# Patient Record
Sex: Female | Born: 2000 | Race: Black or African American | Hispanic: No | Marital: Single | Smoking: Never smoker
Health system: Southern US, Community
[De-identification: ages and names within clinical notes are randomized; demographics above are authoritative.]

## PROBLEM LIST (undated history)

## (undated) DIAGNOSIS — M5126 Other intervertebral disc displacement, lumbar region: Secondary | ICD-10-CM

## (undated) HISTORY — DX: Other intervertebral disc displacement, lumbar region: M51.26

## (undated) HISTORY — PX: NO PAST SURGERIES: SHX2092

---
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
## 2021-09-23 ENCOUNTER — Other Ambulatory Visit: Payer: Self-pay | Admitting: Family Medicine

## 2021-09-23 ENCOUNTER — Ambulatory Visit
Admission: RE | Admit: 2021-09-23 | Discharge: 2021-09-23 | Disposition: A | Discharge status: Home / Self Care | Payer: Medicaid Other | Source: Ambulatory Visit | Attending: Family Medicine | Admitting: Family Medicine

## 2021-09-23 ENCOUNTER — Other Ambulatory Visit: Payer: Self-pay

## 2021-09-23 DIAGNOSIS — M25561 Pain in right knee: Secondary | ICD-10-CM

## 2021-09-23 DIAGNOSIS — M545 Low back pain, unspecified: Secondary | ICD-10-CM

## 2021-09-23 DIAGNOSIS — M25562 Pain in left knee: Secondary | ICD-10-CM

## 2021-12-11 NOTE — L&D Delivery Note (Signed)
Delivery Note Sydney Hart is a 21 y.o. G1P0 at [redacted]w[redacted]d admitted for IOL d/t elective/postdates.   GBS Status: Positive/-- (08/02 1610) Maximum Maternal Temperature: 98.9  Labor course: Initial SVE: 0/th/ballotable. Augmentation with: Pitocin, Cytotec, and IP Foley.  SROM: 3h 23m with clear fluid (2140), progressed rather quickly to complete and did not make it into water for waterbirth. Pushed in squatting position on bed, holding onto squat bar Birth: At 0137 a viable female was delivered via spontaneous vaginal delivery (Presentation: LOA). Nuchal cord present: No.  Head delivered w/ pt in squatting position, then leaned into semi-fowler's to allow for birth of remainder of body. Shoulders and body delivered in usual fashion. Infant placed directly on mom's abdomen for bonding/skin-to-skin, baby dried and stimulated. Cord clamped x 2 after 1 minute and cut by pt's mom.  Cord blood collected.  The placenta separated spontaneously and delivered via gentle cord traction.  Pitocin infused rapidly IV per protocol.  Fundus firm with massage.  Placenta inspected and appears to be intact with a 3 VC.  Placenta/Cord with the following complications: none .  Cord pH: not done Sponge and instrument count were correct x2.  Intrapartum complications:  None Anesthesia:   IV fentanyl and nitrous for labor pain relief; local for repair Episiotomy: none Lacerations:  2nd degree and Rt labial Suture Repair: 3.0 vicryl EBL (mL): 425 (from lac)   Infant: APGAR (1 MIN): 8   APGAR (5 MINS): 9   APGAR (10 MINS):    Infant weight: pending  Mom to postpartum.  Baby to Couplet care / Skin to Skin. Placenta to L&D   Plans to Breastfeed Contraception: abstinence Circumcision: wants inpatient  Note sent to [facility]: [facility] for pp visit.  Cheral Marker CNM, [facility] 08/09/2022 2:11 AM

## 2022-02-11 ENCOUNTER — Encounter (HOSPITAL_BASED_OUTPATIENT_CLINIC_OR_DEPARTMENT_OTHER): Payer: Self-pay | Admitting: Emergency Medicine

## 2022-02-11 ENCOUNTER — Emergency Department (HOSPITAL_BASED_OUTPATIENT_CLINIC_OR_DEPARTMENT_OTHER)
Admission: EM | Admit: 2022-02-11 | Discharge: 2022-02-11 | Disposition: A | Discharge status: Home / Self Care | Payer: Medicaid Other | Attending: Emergency Medicine | Admitting: Emergency Medicine

## 2022-02-11 ENCOUNTER — Other Ambulatory Visit: Payer: Self-pay

## 2022-02-11 DIAGNOSIS — G8929 Other chronic pain: Secondary | ICD-10-CM | POA: Insufficient documentation

## 2022-02-11 DIAGNOSIS — M5442 Lumbago with sciatica, left side: Secondary | ICD-10-CM

## 2022-02-11 DIAGNOSIS — Z3A00 Weeks of gestation of pregnancy not specified: Secondary | ICD-10-CM | POA: Diagnosis not present

## 2022-02-11 DIAGNOSIS — O26891 Other specified pregnancy related conditions, first trimester: Secondary | ICD-10-CM | POA: Insufficient documentation

## 2022-02-11 MED ORDER — CYCLOBENZAPRINE HCL 10 MG PO TABS: 10.0000 mg | ORAL_TABLET | Freq: Two times a day (BID) | ORAL | 0 refills | Status: DC | PRN

## 2022-02-11 MED ORDER — LIDOCAINE 5 % EX PTCH: 1.0000 | MEDICATED_PATCH | CUTANEOUS | 0 refills | Status: DC

## 2022-02-11 MED ORDER — DICLOFENAC SODIUM 1 % EX GEL: 2.0000 g | Freq: Four times a day (QID) | CUTANEOUS | 1 refills | Status: DC

## 2022-02-11 NOTE — Discharge Instructions (Addendum)
As we discussed it is difficult to treat low back pain with sciatica, disc herniation during pregnancy as there are large classes of medication that cannot be used safely during pregnancy.  In addition to continuing your Tylenol, and ice to the affected area I recommend trying Flexeril which is a muscle relaxant you can take up to twice daily which is safe in pregnancy.  Additionally, you can use topical treatments such as Voltaren gel which you can apply directly where it hurts, or lidocaine patches that you can apply directly where it hurts. ? ?As we discussed I cannot give you long-term work restrictions from the emergency department, you should follow-up with orthopedic surgery, or a spine specialist for further evaluation and work restrictions. ? ?In your discharge information above I attached two spine specialists in [place], as well as the contact information for Dr. Jordan Likes who is an orthopedic doctor who works in [employer] who often can see our patients fairly rapidly.  If you have worsening pain, new numbness, fever, chills please return to the emergency department for further evaluation. ?

## 2022-02-11 NOTE — ED Triage Notes (Signed)
Pt reports chronic mid back pain that radiates up the left side and to her neck. States she has a hx of herniated disc. Pt used to get cortisone shot but can't due to being 3 months pregnant. No relief with tylenol or ice packs.  ?

## 2022-02-11 NOTE — ED Provider Notes (Signed)
?[facility] EMERGENCY DEPT ?Provider Note ? ? ?CSN: [id number] ?Arrival date & time: 02/11/22  1302 ? ?  ? ?History ? ?Chief Complaint  ?Patient presents with  ? Back Pain  ? ? ?Sydney Hart is a 21 y.o. female with a past medical history significant for herniated disc in back, chronic back pain who is also 3 months pregnant.  She is endorsing back pain on the left side with radiation throughout the entire back, up to the neck, and down leg with movement.  She reports it is affecting her work.  She denies any numbness, tingling.  She denies any IV drug use, chronic corticosteroid use other than cortisone injections.  She denies any recent fever, history of cancer.  She is trying Tylenol, ice packs at home with minimal improvement. ? ? ?Back Pain ? ?  ? ?Home Medications ?Prior to Admission medications   ?Medication Sig Start Date End Date Taking? Authorizing Provider  ?cyclobenzaprine (FLEXERIL) 10 MG tablet Take 1 tablet (10 mg total) by mouth 2 (two) times daily as needed for muscle spasms. 02/11/22  Yes Callan Yontz H, PA-C  ?diclofenac Sodium (VOLTAREN) 1 % GEL Apply 2 g topically 4 (four) times daily. 02/11/22  Yes Carrina Schoenberger H, PA-C  ?lidocaine (LIDODERM) 5 % Place 1 patch onto the skin daily. Remove & Discard patch within 12 hours or as directed by MD 02/11/22  Yes Lili Harts H, PA-C  ?Magnesium Oxide 500 MG TABS Take 1 tablet (500 mg total) by mouth daily. 08/08/18   Keturah Shavers, MD  ?riboflavin (VITAMIN B-2) 100 MG TABS tablet Take 1 tablet (100 mg total) by mouth daily. 08/08/18   Keturah Shavers, MD  ?topiramate (TOPAMAX) 50 MG tablet Take 1 tablet (50 mg total) by mouth at bedtime. 08/08/18   Keturah Shavers, MD  ?   ? ?Allergies    ?Other   ? ?Review of Systems   ?Review of Systems  ?Musculoskeletal:  Positive for back pain.  ?All other systems reviewed and are negative. ? ?Physical Exam ?Updated Vital Signs ?BP 128/88 (BP Location: Right Arm)   Pulse 86   Temp  98.3 ?F (36.8 ?C) (Oral)   Resp 18   Ht 5' 3.5" (1.613 m)   Wt 103.9 kg   LMP 09/09/2021   SpO2 98%   BMI 39.93 kg/m?  ?Physical Exam ?Vitals and nursing note reviewed.  ?Constitutional:   ?   General: She is not in acute distress. ?   Appearance: Normal appearance. She is obese.  ?HENT:  ?   Head: Normocephalic and atraumatic.  ?Eyes:  ?   General:     ?   Right eye: No discharge.     ?   Left eye: No discharge.  ?Cardiovascular:  ?   Rate and Rhythm: Normal rate and regular rhythm.  ?   Pulses: Normal pulses.  ?   Comments: Intact DP, PT pulses. ?Pulmonary:  ?   Effort: Pulmonary effort is normal. No respiratory distress.  ?Musculoskeletal:     ?   General: No deformity.  ?   Comments: Tenderness to palpation lumbar paraspinous muscles, no step-offs or deformity noted midline spine.  Patient with intact range of motion, is able to sit up, ambulate without significant difficulty.  ?Skin: ?   General: Skin is warm and dry.  ?   Capillary Refill: Capillary refill takes less than 2 seconds.  ?Neurological:  ?   Mental Status: She is alert and oriented to person,  place, and time.  ?Psychiatric:     ?   Mood and Affect: Mood normal.     ?   Behavior: Behavior normal.  ? ? ?ED Results / Procedures / Treatments   ?Labs ?(all labs ordered are listed, but only abnormal results are displayed) ?Labs Reviewed - No data to display ? ?EKG ?None ? ?Radiology ?No results found. ? ?Procedures ?Procedures  ? ? ?Medications Ordered in ED ?Medications - No data to display ? ?ED Course/ Medical Decision Making/ A&P ?  ?                        ?Medical Decision Making ?Risk ?Prescription drug management. ? ? ?This is an overall well-appearing patient with past medical history significant for previous herniation of disc, obesity, chronic back pain who presents with new mid to low back pain with radiation into the neck, and down leg.  She reports that she has talked to a spine surgeon before but they did not recommend any treatment.   At this time she is trying Tylenol, ice packs with minimal relief.  She reports worsening pain with movement.  She denies any red flags of back pain including chronic corticosteroid use, history of cancer, recent fever, IV drug use.  She has no signs or symptoms of cauda equina syndrome including no saddle anesthesia, urinary incontinence, fecal incontinence. ? ?Discussed inappropriate to use radiographic imaging and pregnancy based on patient's chronic pain and no traumatic injury.  Discussed she can add on topical lidocaine patches, Voltaren gel, as well as begin Flexeril which is category B in overall safe in pregnancy.  Otherwise discussed there is not much additional treatment in pregnancy.  Encourage follow-up with orthopedics for further evaluation, recheck, and for long-term work precautions if patient continues to have inability to work from back pain.  Patient discharged in stable condition at this time, return precautions given. ?Final Clinical Impression(s) / ED Diagnoses ?Final diagnoses:  ?Acute left-sided low back pain with left-sided sciatica  ? ? ?Rx / DC Orders ?ED Discharge Orders   ? ?      Ordered  ?  cyclobenzaprine (FLEXERIL) 10 MG tablet  2 times daily PRN       ? 02/11/22 1427  ?  diclofenac Sodium (VOLTAREN) 1 % GEL  4 times daily       ? 02/11/22 1427  ?  lidocaine (LIDODERM) 5 %  Every 24 hours       ? 02/11/22 1427  ? ?  ?  ? ?  ? ? ?  ?Olene Floss, PA-C ?02/11/22 1437 ? ?  ?Pricilla Loveless, MD ?02/12/22 0710 ? ?

## 2022-02-20 ENCOUNTER — Other Ambulatory Visit: Payer: Self-pay

## 2022-02-20 ENCOUNTER — Ambulatory Visit (INDEPENDENT_AMBULATORY_CARE_PROVIDER_SITE_OTHER): Payer: Medicaid Other

## 2022-02-20 VITALS — BP 116/77 | HR 88 | Ht 63.0 in | Wt 226.5 lb

## 2022-02-20 DIAGNOSIS — Z3401 Encounter for supervision of normal first pregnancy, first trimester: Secondary | ICD-10-CM

## 2022-02-20 DIAGNOSIS — O3680X0 Pregnancy with inconclusive fetal viability, not applicable or unspecified: Secondary | ICD-10-CM

## 2022-02-20 DIAGNOSIS — Z3400 Encounter for supervision of normal first pregnancy, unspecified trimester: Secondary | ICD-10-CM | POA: Insufficient documentation

## 2022-02-20 MED ORDER — BLOOD PRESSURE KIT DEVI: 1.0000 | 0 refills | Status: AC

## 2022-02-20 MED ORDER — VITAFOL GUMMIES 3.33-0.333-34.8 MG PO CHEW: 3.0000 | CHEWABLE_TABLET | Freq: Every day | ORAL | 11 refills | Status: DC

## 2022-02-20 MED ORDER — GOJJI WEIGHT SCALE MISC: 1.0000 | 0 refills | Status: DC

## 2022-02-20 NOTE — Progress Notes (Signed)
New OB Intake ? ?I connected with  Sydney Hart on 02/20/22 at  1:10 PM EDT by in person and verified that I am speaking with the correct person using two identifiers. Nurse is located at [facility] and pt is located at [place]. ? ?I discussed the limitations, risks, security and privacy concerns of performing an evaluation and management service by telephone and the availability of in person appointments. I also discussed with the patient that there may be a patient responsible charge related to this service. The patient expressed understanding and agreed to proceed. ? ?I explained I am completing New OB Intake today. We discussed her EDD of 08/03/22 that is based on second trimester scan. Pt is G1/P0. I reviewed her allergies, medications, Medical/Surgical/OB history, and appropriate screenings. I informed her of [facility] services. Based on history, this is a/an  pregnancy uncomplicated .  ? ?Patient Active Problem List  ? Diagnosis Date Noted  ? Migraine without aura and without status migrainosus, not intractable 08/08/2018  ? Tension headache 08/08/2018  ? ? ?Concerns addressed today ? ?Delivery Plans:  ?Plans to deliver at [facility] [address]  ? ?Waterbirth candidate?  ? ?MyChart/Babyscripts ?MyChart access verified. I explained pt will have some visits in office and some virtually. Babyscripts instructions given and order placed. Patient verifies receipt of registration text/e-mail. Account successfully created and app downloaded. ? ?Blood Pressure Cuff  ?Blood pressure cuff ordered for patient to pick-up from [employer]. Explained after first prenatal appt pt will check weekly and document in Babyscripts. ? ?Weight scale: Patient does / does not  have weight scale. Weight scale ordered for patient to pick up from [employer].  ? ?Anatomy US ?Explained first scheduled [place] will be around 19 weeks. Dating and viability scan performed today. ?Scheduled AFP lab only appointment if [facility] pt for same  day as anatomy US.  ? ?Labs ?Discussed Avelina Laine genetic screening with patient. Would like both Panorama and Horizon drawn at new OB visit.Also if interested in genetic testing, tell patient she will need AFP 15-21 weeks to complete genetic testing .Routine prenatal labs needed. ? ?Covid Vaccine ?Patient has not covid vaccine.  ? ? ?Informed patient of [facility] website  and placed link in her AVS.  ? ?Social Determinants of Health ?Food Insecurity: Patient denies food insecurity. ?WIC Referral: Patient is interested in referral to [facility].  ?Transportation: Patient denies transportation needs. ?Childcare: Discussed no children allowed at ultrasound appointments. Offered childcare services; patient declines childcare services at this time. ? ?Send link to Pregnancy Navigators ? ? ?Placed OB Box on problem list and updated ? ?First visit review ?I reviewed new OB appt with pt. I explained she will have a pelvic exam, ob bloodwork with genetic screening, and PAP smear. Explained pt will be seen by Venia Carbon at first visit; encounter routed to appropriate provider. Explained that patient will be seen by pregnancy navigator following visit with provider. [facility] information placed in AVS.  ? ?Hamilton Capri, RN ?02/20/2022  1:11 PM  ?

## 2022-02-20 NOTE — Progress Notes (Addendum)
Patient states that she believes her cycle may have been 10/26/21 and not 12/16, but is unsure due to irregular cycle. LMP of 11/16 would be consistent with today's scan. ?

## 2022-03-01 ENCOUNTER — Ambulatory Visit (INDEPENDENT_AMBULATORY_CARE_PROVIDER_SITE_OTHER): Payer: Medicaid Other | Admitting: Obstetrics and Gynecology

## 2022-03-01 ENCOUNTER — Other Ambulatory Visit: Payer: Self-pay

## 2022-03-01 ENCOUNTER — Encounter: Payer: Self-pay | Admitting: Obstetrics and Gynecology

## 2022-03-01 ENCOUNTER — Other Ambulatory Visit (HOSPITAL_COMMUNITY)
Admission: RE | Admit: 2022-03-01 | Discharge: 2022-03-01 | Disposition: A | Discharge status: Home / Self Care | Payer: Medicaid Other | Source: Ambulatory Visit | Attending: Obstetrics and Gynecology | Admitting: Obstetrics and Gynecology

## 2022-03-01 VITALS — BP 121/72 | HR 83 | Wt 227.8 lb

## 2022-03-01 DIAGNOSIS — Z3A17 17 weeks gestation of pregnancy: Secondary | ICD-10-CM | POA: Diagnosis not present

## 2022-03-01 DIAGNOSIS — O99212 Obesity complicating pregnancy, second trimester: Secondary | ICD-10-CM

## 2022-03-01 DIAGNOSIS — E66811 Obesity, class 1: Secondary | ICD-10-CM

## 2022-03-01 DIAGNOSIS — E669 Obesity, unspecified: Secondary | ICD-10-CM | POA: Insufficient documentation

## 2022-03-01 DIAGNOSIS — Z3482 Encounter for supervision of other normal pregnancy, second trimester: Secondary | ICD-10-CM

## 2022-03-01 DIAGNOSIS — Z3401 Encounter for supervision of normal first pregnancy, first trimester: Secondary | ICD-10-CM | POA: Insufficient documentation

## 2022-03-01 DIAGNOSIS — E668 Other obesity: Secondary | ICD-10-CM

## 2022-03-01 MED ORDER — ASPIRIN EC 81 MG PO TBEC: 81.0000 mg | DELAYED_RELEASE_TABLET | Freq: Every day | ORAL | 11 refills | Status: DC

## 2022-03-01 NOTE — Addendum Note (Signed)
Addended by: Maretta Bees on: 03/01/2022 11:44 AM ? ? Modules accepted: Orders ? ?

## 2022-03-01 NOTE — Progress Notes (Signed)
? ?History:  ? Sydney Hart is a 21 y.o. G1P0 at [redacted]w[redacted]d by LMP being seen today for her first obstetrical visit.  Her obstetrical history is significant for obesity. Patient does intend to breast feed. Pregnancy history fully reviewed. Father of baby not involved. Not a planned pregnancy; she was initially considering abortion, she is now feeling attachment to pregnancy, and feels excited.  She lives with mom- feels well supported.  ? ?Patient reports no complaints. ? ? ?HISTORY: ?OB History  ?Gravida Para Term Preterm AB Living  ?1 0 0 0 0 0  ?SAB IAB Ectopic Multiple Live Births  ?0 0 0 0 0  ?  ?# Outcome Date GA Lbr Len/2nd Weight Sex Delivery Anes PTL Lv  ?1 Current           ?  ?Last pap smear was done NA ? ?History reviewed. No pertinent past medical history. ?Past Surgical History:  ?Procedure Laterality Date  ? NO PAST SURGERIES    ? ?Family History  ?Problem Relation Age of Onset  ? Asthma Mother   ? Bronchitis Mother   ? Breast cancer Maternal Grandmother   ? Asthma Maternal Grandfather   ? Bronchitis Maternal Grandfather   ? Migraines Maternal Aunt   ? Seizures Neg Hx   ? Autism Neg Hx   ? ADD / ADHD Neg Hx   ? Anxiety disorder Neg Hx   ? Depression Neg Hx   ? Bipolar disorder Neg Hx   ? Schizophrenia Neg Hx   ? ?Social History  ? ?Tobacco Use  ? Smoking status: Never  ? Smokeless tobacco: Never  ?Vaping Use  ? Vaping Use: Never used  ?Substance Use Topics  ? Alcohol use: Not Currently  ? Drug use: Not Currently  ?  Types: Marijuana  ?  Comment: stopped June 2022  ? ?Allergies  ?Allergen Reactions  ? Other Itching  ?  Peaches, pears, peas  ? ?Current Outpatient Medications on File Prior to Visit  ?Medication Sig Dispense Refill  ? Blood Pressure Monitoring (BLOOD PRESSURE KIT) DEVI 1 kit by Does not apply route once a week. 1 each 0  ? cetirizine (ZYRTEC) 10 MG tablet Take 1 tablet by mouth daily.    ? cyclobenzaprine (FLEXERIL) 10 MG tablet Take 1 tablet (10 mg total) by mouth 2 (two) times daily  as needed for muscle spasms. 30 tablet 0  ? diclofenac Sodium (VOLTAREN) 1 % GEL Apply 2 g topically 4 (four) times daily. 100 g 1  ? lidocaine (LIDODERM) 5 % Place 1 patch onto the skin daily. Remove & Discard patch within 12 hours or as directed by MD 30 patch 0  ? Prenatal Vit-Fe Phos-FA-Omega (VITAFOL GUMMIES) 3.33-0.333-34.8 MG CHEW Chew 3 tablets by mouth daily. 90 tablet 11  ? Magnesium Oxide 500 MG TABS Take 1 tablet (500 mg total) by mouth daily. (Patient not taking: Reported on 02/20/2022)  0  ? riboflavin (VITAMIN B-2) 100 MG TABS tablet Take 1 tablet (100 mg total) by mouth daily. (Patient not taking: Reported on 02/20/2022)  0  ? ?No current facility-administered medications on file prior to visit.  ? ? ?Review of Systems ?Pertinent items noted in HPI and remainder of comprehensive ROS otherwise negative. ? ?Indications for ASA therapy (per UpToDate) ?One of the following: ?Previous pregnancy with preeclampsia, especially early onset and with an adverse outcome No ?Multifetal gestation No ?Chronic hypertension No ?Type 1 or 2 diabetes mellitus No ?Chronic kidney disease No ?Autoimmune disease (  antiphospholipid syndrome, systemic lupus erythematosus) No ?Two or more of the following: ?Nulliparity Yes ?Obesity (body mass index >30 kg/m2) Yes ?Family history of preeclampsia in mother or sister No ?Age ?35 years No ?Sociodemographic characteristics (African American race, low socioeconomic level) Yes ?Personal risk factors (eg, previous pregnancy with low birth weight or small for gestational age infant, previous adverse pregnancy outcome [eg, stillbirth], interval >10 years between pregnancies) No ? ?Physical Exam:  ? ?Vitals:  ? 03/01/22 0920  ?BP: 121/72  ?Pulse: 83  ?Weight: 227 lb 12.8 oz (103.3 kg)  ? ?Fetal Heart Rate (bpm): 145  Uterine size:   ? ?Patient informed that the ultrasound is considered a limited obstetric ultrasound and is not intended to be a complete ultrasound exam.  Patient also  informed that the ultrasound is not being completed with the intent of assessing for fetal or placental anomalies or any pelvic abnormalities.  Explained that the purpose of today?s ultrasound is to assess for fetal heart rate.  Patient acknowledges the purpose of the exam and the limitations of the study. ?General: well-developed, well-nourished female in no acute distress  ?Skin: normal coloration and turgor, no rashes  ?Neurologic: oriented, normal, negative, normal mood  ?Extremities: normal strength, tone, and muscle mass, ROM of all joints is normal  ?HEENT PERRLA, extraocular movement intact and sclera clear, anicteric  ?Neck supple and no masses  ?Cardiovascular: regular rate and rhythm  ?Respiratory:  no respiratory distress, normal breath sounds  ?Abdomen: soft, non-tender; bowel sounds normal; no masses,  no organomegaly  ?Pelvic: Swabs collected without speculum   ?  ?Assessment:  ?  ?Pregnancy: G1P0 ?Patient Active Problem List  ? Diagnosis Date Noted  ? Obesity (BMI 30.0-34.9) 03/01/2022  ? Supervision of normal first teen pregnancy 02/20/2022  ? Migraine without aura and without status migrainosus, not intractable 08/08/2018  ? Tension headache 08/08/2018  ? ?  ?Plan:  ? ?1. Supervision of normal first teen pregnancy in first trimester ? ?- CBC/D/Plt+RPR+Rh+ABO+Rub Ab... ?- Culture, OB Urine ?- Genetic Screening ?- Cervicovaginal ancillary only( [place]) ?- AFP, Serum, Open Spina Bifida ?- US MFM OB COMP + 14 WK; Future ? ?2. Obesity (BMI 30.0-34.9) ? ?- Start BASA, RX sent. ?- A1c ? ?Initial labs drawn. ?Continue prenatal vitamins. ?Problem list reviewed and updated. ?Genetic Screening discussed, NIPS: requested. ?Ultrasound discussed; fetal anatomic survey: ordered. ?Anticipatory guidance about prenatal visits given including labs, ultrasounds, and testing. ?Discussed usage of the Babyscripts app for more information about pregnancy, and to track blood pressures. ?Also discussed usage of virtual  visits as additional source of managing and completing prenatal visits.   ?Patient was encouraged to use MyChart to review results, send requests, and have questions addressed.   ?The nature of [facility] with multiple MDs and Advanced Practice Providers was explained to patient; also emphasized that residents, students are part of our team. ?Routine obstetric precautions reviewed. Encouraged to seek out care at office or emergency room ([facility] preferred) for urgent and/or emergent concerns. ?No follow-ups on file.  ?  ? ?Rasch, Jennifer I, NP ?Faculty Practice ?[facility], [facility] ?  ?

## 2022-03-01 NOTE — Progress Notes (Signed)
Patient presents for a New OB visit. Patient other concerns today. Intake completed on 3/13. ?

## 2022-03-02 LAB — CERVICOVAGINAL ANCILLARY ONLY
Candida Glabrata: NEGATIVE
Candida Vaginitis: NEGATIVE
Chlamydia: NEGATIVE
Comment: NEGATIVE
Comment: NEGATIVE
Comment: NEGATIVE
Comment: NEGATIVE
Comment: NORMAL
Neisseria Gonorrhea: NEGATIVE
Trichomonas: NEGATIVE

## 2022-03-03 LAB — AFP, SERUM, OPEN SPINA BIFIDA
AFP MoM: 1.2
AFP Value: 40.5 ng/mL
Gest. Age on Collection Date: 17 weeks
Maternal Age At EDD: 19.9 yr
OSBR Risk 1 IN: 10000
Test Results:: NEGATIVE
Weight: 227 [lb_av]

## 2022-03-03 LAB — CBC/D/PLT+RPR+RH+ABO+RUBIGG...
Antibody Screen: NEGATIVE
Basophils Absolute: 0 10*3/uL (ref 0.0–0.2)
Basos: 0 %
EOS (ABSOLUTE): 0.4 10*3/uL (ref 0.0–0.4)
Eos: 4 %
HCV Ab: NONREACTIVE
HIV Screen 4th Generation wRfx: NONREACTIVE
Hematocrit: 39.4 % (ref 34.0–46.6)
Hemoglobin: 12.7 g/dL (ref 11.1–15.9)
Hepatitis B Surface Ag: NEGATIVE
Immature Grans (Abs): 0 10*3/uL (ref 0.0–0.1)
Immature Granulocytes: 0 %
Lymphocytes Absolute: 2.4 10*3/uL (ref 0.7–3.1)
Lymphs: 24 %
MCH: 26.8 pg (ref 26.6–33.0)
MCHC: 32.2 g/dL (ref 31.5–35.7)
MCV: 83 fL (ref 79–97)
Monocytes Absolute: 0.8 10*3/uL (ref 0.1–0.9)
Monocytes: 8 %
Neutrophils Absolute: 6.1 10*3/uL (ref 1.4–7.0)
Neutrophils: 64 %
Platelets: 332 10*3/uL (ref 150–450)
RBC: 4.73 x10E6/uL (ref 3.77–5.28)
RDW: 14.6 % (ref 11.7–15.4)
RPR Ser Ql: NONREACTIVE
Rh Factor: POSITIVE
Rubella Antibodies, IGG: 3.53 index (ref >0.99)
WBC: 9.7 10*3/uL (ref 3.4–10.8)

## 2022-03-03 LAB — CULTURE, OB URINE

## 2022-03-03 LAB — URINE CULTURE, OB REFLEX

## 2022-03-03 LAB — HCV INTERPRETATION

## 2022-03-07 ENCOUNTER — Encounter: Payer: Self-pay | Admitting: Obstetrics and Gynecology

## 2022-03-16 ENCOUNTER — Encounter: Payer: Self-pay | Admitting: Obstetrics and Gynecology

## 2022-03-16 ENCOUNTER — Telehealth: Payer: Self-pay | Admitting: Emergency Medicine

## 2022-03-16 DIAGNOSIS — D573 Sickle-cell trait: Secondary | ICD-10-CM | POA: Insufficient documentation

## 2022-03-16 NOTE — Telephone Encounter (Signed)
TC to patient, no answer, left voicemail to return call to clinic.  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 ?Genetic results scanned in today.  ?

## 2022-03-17 ENCOUNTER — Other Ambulatory Visit: Payer: Self-pay

## 2022-03-17 ENCOUNTER — Emergency Department (HOSPITAL_BASED_OUTPATIENT_CLINIC_OR_DEPARTMENT_OTHER)
Admission: EM | Admit: 2022-03-17 | Discharge: 2022-03-17 | Disposition: A | Discharge status: Home / Self Care | Payer: Medicaid Other | Attending: Emergency Medicine | Admitting: Emergency Medicine

## 2022-03-17 ENCOUNTER — Encounter (HOSPITAL_BASED_OUTPATIENT_CLINIC_OR_DEPARTMENT_OTHER): Payer: Self-pay | Admitting: Emergency Medicine

## 2022-03-17 DIAGNOSIS — R000 Tachycardia, unspecified: Secondary | ICD-10-CM | POA: Diagnosis not present

## 2022-03-17 DIAGNOSIS — Z7982 Long term (current) use of aspirin: Secondary | ICD-10-CM | POA: Insufficient documentation

## 2022-03-17 DIAGNOSIS — R42 Dizziness and giddiness: Secondary | ICD-10-CM | POA: Diagnosis not present

## 2022-03-17 DIAGNOSIS — R0981 Nasal congestion: Secondary | ICD-10-CM | POA: Insufficient documentation

## 2022-03-17 DIAGNOSIS — Z20822 Contact with and (suspected) exposure to covid-19: Secondary | ICD-10-CM | POA: Diagnosis not present

## 2022-03-17 DIAGNOSIS — R0602 Shortness of breath: Secondary | ICD-10-CM | POA: Insufficient documentation

## 2022-03-17 LAB — RESP PANEL BY RT-PCR (FLU A&B, COVID) ARPGX2
Influenza A by PCR: NEGATIVE
Influenza B by PCR: NEGATIVE
SARS Coronavirus 2 by RT PCR: NEGATIVE

## 2022-03-17 LAB — BASIC METABOLIC PANEL
Anion gap: 9 (ref 5–15)
BUN: 6 mg/dL (ref 6–20)
CO2: 20 mmol/L — ABNORMAL LOW (ref 22–32)
Calcium: 9 mg/dL (ref 8.9–10.3)
Chloride: 99 mmol/L (ref 98–111)
Creatinine, Ser: 0.58 mg/dL (ref 0.44–1.00)
GFR, Estimated: >60 mL/min (ref >60)
Glucose, Bld: 100 mg/dL — ABNORMAL HIGH (ref 70–99)
Potassium: 3.5 mmol/L (ref 3.5–5.1)
Sodium: 128 mmol/L — ABNORMAL LOW (ref 135–145)

## 2022-03-17 LAB — URINALYSIS, ROUTINE W REFLEX MICROSCOPIC
Bilirubin Urine: NEGATIVE
Glucose, UA: NEGATIVE mg/dL
Hgb urine dipstick: NEGATIVE
Ketones, ur: 15 mg/dL — AB
Leukocytes,Ua: NEGATIVE
Nitrite: NEGATIVE
Protein, ur: NEGATIVE mg/dL
Specific Gravity, Urine: 1.015 (ref 1.005–1.030)
pH: 6 (ref 5.0–8.0)

## 2022-03-17 LAB — CBC
HCT: 37.1 % (ref 36.0–46.0)
Hemoglobin: 12.9 g/dL (ref 12.0–15.0)
MCH: 27.8 pg (ref 26.0–34.0)
MCHC: 34.8 g/dL (ref 30.0–36.0)
MCV: 80 fL (ref 80.0–100.0)
Platelets: 321 10*3/uL (ref 150–400)
RBC: 4.64 MIL/uL (ref 3.87–5.11)
RDW: 14.6 % (ref 11.5–15.5)
WBC: 12.1 10*3/uL — ABNORMAL HIGH (ref 4.0–10.5)
nRBC: 0 % (ref 0.0–0.2)

## 2022-03-17 LAB — HCG, QUANTITATIVE, PREGNANCY: hCG, Beta Chain, Quant, S: 9647 m[IU]/mL — ABNORMAL HIGH (ref <5)

## 2022-03-17 LAB — CBG MONITORING, ED: Glucose-Capillary: 84 mg/dL (ref 70–99)

## 2022-03-17 MED ORDER — MECLIZINE HCL 25 MG PO TABS
25.0000 mg | ORAL_TABLET | Freq: Once | ORAL | Status: AC
  Administered 2022-03-17: 25 mg via ORAL
  Filled 2022-03-17: qty 1

## 2022-03-17 MED ORDER — MECLIZINE HCL 25 MG PO TABS: 25.0000 mg | ORAL_TABLET | Freq: Three times a day (TID) | ORAL | 0 refills | Status: DC | PRN

## 2022-03-17 MED ORDER — LACTATED RINGERS IV BOLUS
1000.0000 mL | Freq: Once | INTRAVENOUS | Status: AC
Start: 2022-03-17 — End: 2022-03-17
  Administered 2022-03-17: 1000 mL via INTRAVENOUS

## 2022-03-17 NOTE — Discharge Instructions (Signed)
Your workup today was very reassuring and there does not seem to a be an emergent cause for you dizziness. I have given you a medication called meclizine  which is used for dizziness which you can try for your symptoms. Please call your OB on Monday for evaluation if your dizziness persists ?

## 2022-03-17 NOTE — ED Triage Notes (Signed)
Dizziness and shortness of breath started yesterday , 5 months pregnant.  ?Dizziness getting worse with motion.  ?

## 2022-03-17 NOTE — ED Provider Notes (Signed)
?[facility] EMERGENCY DEPARTMENT ?Provider Note ? ? ?CSN: [id number] ?Arrival date & time: 03/17/22  1458 ? ?  ? ?History ? ?Chief Complaint  ?Patient presents with  ? Shortness of Breath  ? Dizziness  ? ? ?Sydney Hart is a 21 y.o. female approximately [redacted] weeks pregnant who presents to the ED for evaluation of shortness of breath and dizziness that started last night. Patient states that she noted sob just prior to going to sleep that resolved on its own without treatment. She went to work today, and while at work continued to have intermittent sob, followed by dizziness. Dizziness has not improved and she continues to feel dizzy at rest during my evaluation. She states "it feels like it's in the front of my head." She denies headache, cough, fever, chest pain, abd pain, n/v/d. She has some mild congestion d/t allergies. Pt has been trying to maintain her water intake and has had two water bottles of water today, although her mother states that patient has not been eating very much. No treatment prior to arrival. ? ? ?Shortness of Breath ?Dizziness ?Associated symptoms: shortness of breath   ? ?  ? ?Home Medications ?Prior to Admission medications   ?Medication Sig Start Date End Date Taking? Authorizing Provider  ?meclizine (ANTIVERT) 25 MG tablet Take 1 tablet (25 mg total) by mouth 3 (three) times daily as needed for dizziness. 03/17/22  Yes Kathe Becton R, PA-C  ?aspirin EC 81 MG tablet Take 1 tablet (81 mg total) by mouth daily. Swallow whole. 03/01/22   Rasch, Artist Pais, NP  ?Blood Pressure Monitoring (BLOOD PRESSURE KIT) DEVI 1 kit by Does not apply route once a week. 02/20/22   Constant, Peggy, MD  ?cetirizine (ZYRTEC) 10 MG tablet Take 1 tablet by mouth daily. 10/15/14   [provider]  ?cyclobenzaprine (FLEXERIL) 10 MG tablet Take 1 tablet (10 mg total) by mouth 2 (two) times daily as needed for muscle spasms. 02/11/22   Prosperi, Christian H, PA-C  ?diclofenac Sodium (VOLTAREN) 1 %  GEL Apply 2 g topically 4 (four) times daily. 02/11/22   Prosperi, Christian H, PA-C  ?lidocaine (LIDODERM) 5 % Place 1 patch onto the skin daily. Remove & Discard patch within 12 hours or as directed by MD 02/11/22   Prosperi, Joesph Fillers, PA-C  ?Magnesium Oxide 500 MG TABS Take 1 tablet (500 mg total) by mouth daily. ?Patient not taking: Reported on 02/20/2022 08/08/18   Teressa Lower, MD  ?Prenatal Vit-Fe Phos-FA-Omega (VITAFOL GUMMIES) 3.33-0.333-34.8 MG CHEW Chew 3 tablets by mouth daily. 02/20/22   Constant, Peggy, MD  ?riboflavin (VITAMIN B-2) 100 MG TABS tablet Take 1 tablet (100 mg total) by mouth daily. ?Patient not taking: Reported on 02/20/2022 08/08/18   Teressa Lower, MD  ?   ? ?Allergies    ?Other   ? ?Review of Systems   ?Review of Systems  ?Respiratory:  Positive for shortness of breath.   ?Neurological:  Positive for dizziness.  ? ?Physical Exam ?Updated Vital Signs ?BP 124/74   Pulse 92   Temp 97.9 ?F (36.6 ?C) (Oral)   Resp 16   Ht $R'5\' 3"'IM$  (1.6 m)   Wt 96.6 kg   LMP 11/25/2021   SpO2 99%   BMI 37.73 kg/m?  ?Physical Exam ?Vitals and nursing note reviewed.  ?Constitutional:   ?   General: She is not in acute distress. ?   Appearance: She is not ill-appearing.  ?HENT:  ?   Head: Atraumatic.  ?  Eyes:  ?   Conjunctiva/sclera: Conjunctivae normal.  ?Cardiovascular:  ?   Rate and Rhythm: Regular rhythm.  ?   Pulses: Normal pulses.  ?   Heart sounds: No murmur heard. ?   Comments: Pt tachycardic to the 110s, although was receiving IV and blood draw. EKG with HR of 86 ?Pulmonary:  ?   Effort: Pulmonary effort is normal. No respiratory distress.  ?   Breath sounds: Normal breath sounds. No decreased breath sounds or wheezing.  ?Abdominal:  ?   General: Abdomen is flat. There is no distension.  ?   Palpations: Abdomen is soft.  ?   Tenderness: There is no abdominal tenderness.  ?Musculoskeletal:     ?   General: Normal range of motion.  ?   Cervical back: Normal range of motion.  ?Skin: ?   General: Skin  is warm and dry.  ?   Capillary Refill: Capillary refill takes less than 2 seconds.  ?Neurological:  ?   General: No focal deficit present.  ?   Mental Status: She is alert.  ?Psychiatric:     ?   Mood and Affect: Mood normal.  ? ? ?ED Results / Procedures / Treatments   ?Labs ?(all labs ordered are listed, but only abnormal results are displayed) ?Labs Reviewed  ?BASIC METABOLIC PANEL - Abnormal; Notable for the following components:  ?    Result Value  ? Sodium 128 (*)   ? CO2 20 (*)   ? Glucose, Bld 100 (*)   ? All other components within normal limits  ?CBC - Abnormal; Notable for the following components:  ? WBC 12.1 (*)   ? All other components within normal limits  ?URINALYSIS, ROUTINE W REFLEX MICROSCOPIC - Abnormal; Notable for the following components:  ? Ketones, ur 15 (*)   ? All other components within normal limits  ?HCG, QUANTITATIVE, PREGNANCY - Abnormal; Notable for the following components:  ? hCG, Beta Chain, Quant, [place] 9,647 (*)   ? All other components within normal limits  ?RESP PANEL BY RT-PCR (FLU A&B, COVID) ARPGX2  ?CBG MONITORING, ED  ? ? ?EKG ?None ? ?Radiology ?No results found. ? ?Procedures ?Procedures  ? ? ?Medications Ordered in ED ?Medications  ?lactated ringers bolus 1,000 mL (0 mLs Intravenous Stopped 03/17/22 1639)  ?meclizine (ANTIVERT) tablet 25 mg (25 mg Oral Given 03/17/22 1738)  ? ? ?ED Course/ Medical Decision Making/ A&P ?Clinical Course as of 03/17/22 1757  ?Fri Mar 17, 2022  ?1742 HCG, Bosie Helper(!): 6,606 [EC]  ?  ?Clinical Course User Index ?[EC] Tonye Pearson, PA-C  ? ?                        ?Medical Decision Making ?Amount and/or Complexity of Data Reviewed ?Labs: ordered. ? ? ?History:  ?Per HPI ?Social determinants of health: teen pregnancy ? ?Initial impression: ? ?This patient presents to the ED for concern of dizziness and sob while pregnant, this involves an extensive number of treatment options, and is a complaint that carries with it a high risk of  complications and morbidity.    ? ? ?Lab Tests and EKG: ? ?I Ordered, reviewed, and interpreted labs and EKG.  The pertinent results include:  ?EKG without acute finding ?Sodium 128 ? ?Cardiac Monitoring: ? ?The patient was maintained on a cardiac monitor.  I personally viewed and interpreted the cardiac monitored which showed an underlying rhythm of: Sinus arrhythmia ? ? ?  Medicines ordered and prescription drug management: ? ?I ordered medication including: ?1L LR bolus   ?Meclizine 25 mg ?Reevaluation of the patient after these medicines showed that the patient improved ?I have reviewed the patients home medicines and have made adjustments as needed ? ? ? ?ED Course: ?22 year old female approximately [redacted] weeks pregnant presents to the ED for evaluation of dizziness for 1 day.  Physical exam is unremarkable.  EKG without acute findings. Labs without acute findings. Her sodium was mildly depressed at 128, but does not seem significant enough to cause her symptoms. She was given meclizine 24m, shown to be safe in pregnancy, with some improvement. Concern for pregnancy complication is low given patient's lack of abdominal pain, vaginal bleeding. She is to follow up with OBGYN next week. ? ?Disposition: ? ?After consideration of the diagnostic results, physical exam, history and the patients response to treatment feel that the patent would benefit from discharge.   ?Dizziness: Pt given fluids and meclizine with some improvement. Encourage to drink plenty of water and maintain adequate food intake. Advised to f/u with OBGYN after the weekend. All questions asked and answered. D/cd in good condition. ? ? ?Final Clinical Impression(s) / ED Diagnoses ?Final diagnoses:  ?Dizziness  ? ? ?Rx / DC Orders ?ED Discharge Orders   ? ?      Ordered  ?  meclizine (ANTIVERT) 25 MG tablet  3 times daily PRN       ? 03/17/22 1729  ? ?  ?  ? ?  ? ? ?  ?CTonye Pearson [place]?03/17/22 1757 ? ?  ?DLucrezia Starch MD ?03/20/22 1623 ? ?

## 2022-03-29 ENCOUNTER — Ambulatory Visit (INDEPENDENT_AMBULATORY_CARE_PROVIDER_SITE_OTHER): Payer: Medicaid Other

## 2022-03-29 VITALS — BP 134/79 | HR 80 | Wt 230.0 lb

## 2022-03-29 DIAGNOSIS — Z3402 Encounter for supervision of normal first pregnancy, second trimester: Secondary | ICD-10-CM

## 2022-03-29 DIAGNOSIS — O99212 Obesity complicating pregnancy, second trimester: Secondary | ICD-10-CM

## 2022-03-29 NOTE — Progress Notes (Signed)
ROB she is concerned about her weight loss, she weighed more before she was pregnant.  ?

## 2022-03-29 NOTE — Patient Instructions (Signed)

## 2022-03-29 NOTE — Progress Notes (Signed)
? ?  PRENATAL VISIT NOTE ? ?Subjective:  ?Sydney Hart is a 21 y.o. G1P0 at [redacted]w[redacted]d being seen today for ongoing prenatal care.  She is currently monitored for the following issues for this low-risk pregnancy and has Migraine without aura and without status migrainosus, not intractable; Tension headache; Supervision of normal first teen pregnancy; Obesity (BMI 30.0-34.9); and Sickle-cell trait ([facility]) on their problem list. ? ?Patient reports no complaints.  Contractions: Not present. Vag. Bleeding: None.  Movement: Present. Denies leaking of fluid.  ? ?The following portions of the patient's history were reviewed and updated as appropriate: allergies, current medications, past family history, past medical history, past social history, past surgical history and problem list.  ? ?Objective:  ? ?Vitals:  ? 03/29/22 1044  ?BP: 134/79  ?Pulse: 80  ?Weight: 230 lb (104.3 kg)  ? ? ?Fetal Status: Fetal Heart Rate (bpm): 151   Movement: Present    ? ?General:  Alert, oriented and cooperative. Patient is in no acute distress.  ?Skin: Skin is warm and dry. No rash noted.   ?Cardiovascular: Normal heart rate noted  ?Respiratory: Normal respiratory effort, no problems with respiration noted  ?Abdomen: Soft, gravid, appropriate for gestational age.  Pain/Pressure: Absent     ?Pelvic: Cervical exam deferred        ?Extremities: Normal range of motion.  Edema: None  ?Mental Status: Normal mood and affect. Normal behavior. Normal judgment and thought content.  ? ?Assessment and Plan:  ?Pregnancy: G1P0 at [redacted]w[redacted]d ?1. Supervision of normal first teen pregnancy in second trimester ?- Routine OB. Doing well, no concerns ?- Anticipatory guidance for upcoming appointments provided ?- Interested in waterbirth, questions answered. Pt to enroll in class, see midwives for most visits. Discussed waterbirth as option for low risk pregnancy. Pregnancy is hard to predict and things may arise that prevent immersion in water but labor can be  supported with freedom of movement, birthing ball, shower and birth can be supported in position of pt choosing, etc, even if waterbirth becomes unavailable. Pt states understanding. ? ?2. Obesity affecting pregnancy in second trimester ?- Continue bASA ? ? ?Preterm labor symptoms and general obstetric precautions including but not limited to vaginal bleeding, contractions, leaking of fluid and fetal movement were reviewed in detail with the patient. ?Please refer to After Visit Summary for other counseling recommendations.  ? ?Return in about 4 weeks (around 04/26/2022). ? ?Future Appointments  ?Date Time Provider Department Center  ?03/30/2022  7:30 AM [facility] NURSE [facility]  ?03/30/2022  7:45 AM [facility] US6 [facility]  ?04/26/2022  8:35 AM Corlis Hove, NP [facility] None  ? ? ?Brand Males, CNM ? ?

## 2022-03-30 ENCOUNTER — Ambulatory Visit: Payer: Medicaid Other

## 2022-03-30 ENCOUNTER — Encounter: Payer: Self-pay | Admitting: *Deleted

## 2022-03-30 ENCOUNTER — Other Ambulatory Visit: Payer: Self-pay | Admitting: *Deleted

## 2022-03-30 ENCOUNTER — Ambulatory Visit: Payer: Medicaid Other | Attending: Obstetrics and Gynecology

## 2022-03-30 ENCOUNTER — Ambulatory Visit: Payer: Medicaid Other | Admitting: *Deleted

## 2022-03-30 VITALS — BP 128/72 | HR 83

## 2022-03-30 DIAGNOSIS — D573 Sickle-cell trait: Secondary | ICD-10-CM | POA: Diagnosis not present

## 2022-03-30 DIAGNOSIS — Z363 Encounter for antenatal screening for malformations: Secondary | ICD-10-CM | POA: Insufficient documentation

## 2022-03-30 DIAGNOSIS — Z3402 Encounter for supervision of normal first pregnancy, second trimester: Secondary | ICD-10-CM

## 2022-03-30 DIAGNOSIS — O99212 Obesity complicating pregnancy, second trimester: Secondary | ICD-10-CM | POA: Diagnosis present

## 2022-03-30 DIAGNOSIS — Z3401 Encounter for supervision of normal first pregnancy, first trimester: Secondary | ICD-10-CM | POA: Diagnosis not present

## 2022-03-30 DIAGNOSIS — D5700 Hb-SS disease with crisis, unspecified: Secondary | ICD-10-CM

## 2022-03-30 DIAGNOSIS — E669 Obesity, unspecified: Secondary | ICD-10-CM | POA: Insufficient documentation

## 2022-03-30 DIAGNOSIS — O99012 Anemia complicating pregnancy, second trimester: Secondary | ICD-10-CM | POA: Diagnosis not present

## 2022-03-30 DIAGNOSIS — Z3A22 22 weeks gestation of pregnancy: Secondary | ICD-10-CM | POA: Diagnosis not present

## 2022-04-25 NOTE — Progress Notes (Signed)
? ?  PRENATAL VISIT NOTE ? ?Subjective:  ?Sydney Hart is a 21 y.o. G1P0 at [redacted]w[redacted]d being seen today for ongoing prenatal care.  She is currently monitored for the following issues for this low-risk pregnancy and has Migraine without aura and without status migrainosus, not intractable; Tension headache; Supervision of normal first teen pregnancy; Obesity (BMI 30.0-34.9); and Sickle-cell trait ([place]) on their problem list. ? ?Patient reports no complaints.  Contractions: Not present. Vag. Bleeding: None.  Movement: Present. Denies leaking of fluid.  ? ?The following portions of the patient's history were reviewed and updated as appropriate: allergies, current medications, past family history, past medical history, past social history, past surgical history and problem list.  ? ?Objective:  ? ?Vitals:  ? 04/26/22 0842  ?BP: 127/83  ?Pulse: 81  ?Weight: 233 lb 12.8 oz (106.1 kg)  ? ? ?Fetal Status: Fetal Heart Rate (bpm): 139 Fundal Height: 25 cm Movement: Present    ? ?General:  Alert, oriented and cooperative. Patient is in no acute distress.  ?Skin: Skin is warm and dry. No rash noted.   ?Cardiovascular: Normal heart rate noted  ?Respiratory: Normal respiratory effort, no problems with respiration noted  ?Abdomen: Soft, gravid, appropriate for gestational age.  Pain/Pressure: Present     ?Pelvic: Cervical exam deferred        ?Extremities: Normal range of motion.  Edema: None  ?Mental Status: Normal mood and affect. Normal behavior. Normal judgment and thought content.  ? ?Assessment and Plan:  ?Pregnancy: G1P0 at [redacted]w[redacted]d ?1. Supervision of normal first teen pregnancy in second trimester ?- Routine OB, patient doing well ?-Discussed next step for pursuing waterbirth as option for delivery. Encouraged to enroll in class and discussed the importance of seeing midwives for most visits ? ? ?2. Obesity (BMI 30.0-34.9) ?-Continue BASA, home BPs have remained WDL ?- U/S scheduled for 06/01 ? ?3. [redacted] weeks gestation of  pregnancy ?- Routine follow-up at 28 weeks ?- Sickle Cell Carrier ? ? ?4. Migraine without aura and without status migrainosus, not intractable ?- Patient has had a decrease in number of headaches experienced ?-Current comfort measures, such as taking a nap, are tolerable and working for patient when a headache presents ?-Discussed symptoms warranting Medical Attention ? ? ?Preterm labor symptoms and general obstetric precautions including but not limited to vaginal bleeding, contractions, leaking of fluid and fetal movement were reviewed in detail with the patient. ?Please refer to After Visit Summary for other counseling recommendations.  ? ?Return in about 2 weeks (around 05/10/2022) for LOB/GTT. ? ?Future Appointments  ?Date Time Provider [place]  ?05/10/2022  9:00 AM [facility] LAB [facility] None  ?05/11/2022 11:15 AM [facility] NURSE [facility]  ?05/11/2022 11:30 AM [facility] US3 [facility] [place]  ?05/15/2022 10:35 AM Laury Deep, CNM [facility] None  ? ? ?Johnston Ebbs, NP ? ?

## 2022-04-26 ENCOUNTER — Encounter: Payer: Self-pay | Admitting: Student

## 2022-04-26 ENCOUNTER — Ambulatory Visit (INDEPENDENT_AMBULATORY_CARE_PROVIDER_SITE_OTHER): Payer: Medicaid Other | Admitting: Student

## 2022-04-26 VITALS — BP 127/83 | HR 81 | Wt 233.8 lb

## 2022-04-26 DIAGNOSIS — Z3A25 25 weeks gestation of pregnancy: Secondary | ICD-10-CM

## 2022-04-26 DIAGNOSIS — E669 Obesity, unspecified: Secondary | ICD-10-CM

## 2022-04-26 DIAGNOSIS — G43009 Migraine without aura, not intractable, without status migrainosus: Secondary | ICD-10-CM

## 2022-04-26 DIAGNOSIS — Z3402 Encounter for supervision of normal first pregnancy, second trimester: Secondary | ICD-10-CM

## 2022-05-10 ENCOUNTER — Other Ambulatory Visit: Payer: Medicaid Other

## 2022-05-10 DIAGNOSIS — Z3402 Encounter for supervision of normal first pregnancy, second trimester: Secondary | ICD-10-CM

## 2022-05-11 ENCOUNTER — Encounter: Payer: Self-pay | Admitting: *Deleted

## 2022-05-11 ENCOUNTER — Other Ambulatory Visit: Payer: Self-pay | Admitting: *Deleted

## 2022-05-11 ENCOUNTER — Ambulatory Visit: Payer: Medicaid Other | Attending: Obstetrics and Gynecology

## 2022-05-11 ENCOUNTER — Ambulatory Visit: Payer: Medicaid Other | Admitting: *Deleted

## 2022-05-11 VITALS — BP 122/67 | HR 88

## 2022-05-11 DIAGNOSIS — O99213 Obesity complicating pregnancy, third trimester: Secondary | ICD-10-CM

## 2022-05-11 DIAGNOSIS — Z362 Encounter for other antenatal screening follow-up: Secondary | ICD-10-CM | POA: Diagnosis present

## 2022-05-11 DIAGNOSIS — O99212 Obesity complicating pregnancy, second trimester: Secondary | ICD-10-CM | POA: Insufficient documentation

## 2022-05-11 DIAGNOSIS — D5700 Hb-SS disease with crisis, unspecified: Secondary | ICD-10-CM

## 2022-05-11 DIAGNOSIS — Z3A28 28 weeks gestation of pregnancy: Secondary | ICD-10-CM

## 2022-05-11 DIAGNOSIS — E669 Obesity, unspecified: Secondary | ICD-10-CM | POA: Diagnosis not present

## 2022-05-11 DIAGNOSIS — O99013 Anemia complicating pregnancy, third trimester: Secondary | ICD-10-CM | POA: Diagnosis not present

## 2022-05-11 DIAGNOSIS — O99019 Anemia complicating pregnancy, unspecified trimester: Secondary | ICD-10-CM

## 2022-05-11 LAB — CBC
Hematocrit: 34.2 % (ref 34.0–46.6)
Hemoglobin: 11.8 g/dL (ref 11.1–15.9)
MCH: 28.4 pg (ref 26.6–33.0)
MCHC: 34.5 g/dL (ref 31.5–35.7)
MCV: 82 fL (ref 79–97)
Platelets: 278 10*3/uL (ref 150–450)
RBC: 4.15 x10E6/uL (ref 3.77–5.28)
RDW: 13.4 % (ref 11.7–15.4)
WBC: 10.9 10*3/uL — ABNORMAL HIGH (ref 3.4–10.8)

## 2022-05-11 LAB — HIV ANTIBODY (ROUTINE TESTING W REFLEX): HIV Screen 4th Generation wRfx: NONREACTIVE

## 2022-05-11 LAB — GLUCOSE TOLERANCE, 2 HOURS W/ 1HR
Glucose, 1 hour: 107 mg/dL (ref 70–179)
Glucose, 2 hour: 95 mg/dL (ref 70–152)
Glucose, Fasting: 80 mg/dL (ref 70–91)

## 2022-05-11 LAB — RPR: RPR Ser Ql: NONREACTIVE

## 2022-05-15 ENCOUNTER — Ambulatory Visit (INDEPENDENT_AMBULATORY_CARE_PROVIDER_SITE_OTHER): Payer: Medicaid Other | Admitting: Obstetrics and Gynecology

## 2022-05-15 ENCOUNTER — Encounter: Payer: Self-pay | Admitting: Obstetrics and Gynecology

## 2022-05-15 VITALS — BP 129/83 | HR 93 | Wt 237.2 lb

## 2022-05-15 DIAGNOSIS — Z3A28 28 weeks gestation of pregnancy: Secondary | ICD-10-CM

## 2022-05-15 DIAGNOSIS — D573 Sickle-cell trait: Secondary | ICD-10-CM

## 2022-05-15 DIAGNOSIS — Z3403 Encounter for supervision of normal first pregnancy, third trimester: Secondary | ICD-10-CM

## 2022-05-15 DIAGNOSIS — O99213 Obesity complicating pregnancy, third trimester: Secondary | ICD-10-CM | POA: Insufficient documentation

## 2022-05-15 NOTE — Progress Notes (Addendum)
LOW-RISK PREGNANCY OFFICE VISIT Patient name: Sydney Hart MRN [id number]  Date of birth: September 11, 2001 Chief Complaint:   Routine Prenatal Visit  History of Present Illness:   Sydney Hart is a 21 y.o. G1P0 female at [redacted]w[redacted]d with an Estimated Date of Delivery: 08/03/22 being seen today for ongoing management of a low-risk pregnancy.  Today she reports  leaking colostrum and "very sensitive nipples" that hurt when trying to clean . Requesting a cream that can help with this problem. Contractions: Not present. Vag. Bleeding: None.  Movement: Present. denies leaking of fluid. Review of Systems:   Pertinent items are noted in HPI Denies abnormal vaginal discharge w/ itching/odor/irritation, headaches, visual changes, shortness of breath, chest pain, abdominal pain, severe nausea/vomiting, or problems with urination or bowel movements unless otherwise stated above. Pertinent History Reviewed:  Reviewed past medical,surgical, social, obstetrical and family history.  Reviewed problem list, medications and allergies. Physical Assessment:   Vitals:   05/15/22 1044  BP: 129/83  Pulse: 93  Weight: 237 lb 3.2 oz (107.6 kg)  Body mass index is 42.02 kg/m.        Physical Examination:   General appearance: Well appearing, and in no distress  Mental status: Alert, oriented to person, place, and time  Skin: Warm & dry  Cardiovascular: Normal heart rate noted  Respiratory: Normal respiratory effort, no distress  Abdomen: Soft, gravid, nontender  Pelvic: Cervical exam deferred         Extremities: Edema: None  Fetal Status: Fetal Heart Rate (bpm): 138 Fundal Height: 32 cm Movement: Present Presentation: Undeterminable  No results found for this or any previous visit (from the past 24 hour(s)).  Assessment & Plan:  1) Low-risk pregnancy G1P0 at [redacted]w[redacted]d with an Estimated Date of Delivery: 08/03/22   2) Supervision of normal first teen pregnancy in third trimester - Reviewed normal 2hr  GTT results - Discussed leaking colostrum at this gestation is not unusual; actually is a positive sign for could breastmilk production - Advised there is no cream to help with nipple sensitivity. - Advised to squeeze warm soapy water over nipple and dried colostrum allowing it to loosen. Then gently squeeze plan warm water over nipple to rinse. No need to scrub nipples.  - Reassurance given that leaking colostrum is a normal variation of pregnancy is ok - Advised that when colostrum leaks out to rub it in to nipple area to help moisturize and lubricate nipples. - Desires waterbirth. Scheduled to take the class on either 6/15 or 6/22 (unsure since scheduled for 2 different classes within 1 week). Advised will need to bring signed certificate to OB visit after class. To sign consent for waterbirth at hospital since candidacy can change even while in labor. Patient verbalized an understanding of the plan of care and agrees.   3) Sickle-cell trait (HCC)  - Culture, OB Urine  4) Obesity affecting pregnancy in third trimester - Taking daily bASA  5) [redacted] weeks gestation of pregnancy    Meds: No orders of the defined types were placed in this encounter.  Labs/procedures today: UCx  Plan:  Continue routine obstetrical care   Reviewed: Preterm labor symptoms and general obstetric precautions including but not limited to vaginal bleeding, contractions, leaking of fluid and fetal movement were reviewed in detail with the patient.  All questions were answered. Has home bp cuff. Check bp weekly, let us know if >140/90.   Follow-up: Return in about 4 weeks (around 06/12/2022) for Return OB visit.  Orders Placed This Encounter  Procedures   Culture, OB Urine   Raelyn Mora MSN, CNM 05/15/2022 11:18 AM

## 2022-05-15 NOTE — Progress Notes (Signed)
Pt presents for ROB visit.  

## 2022-05-17 LAB — URINE CULTURE, OB REFLEX

## 2022-05-17 LAB — CULTURE, OB URINE

## 2022-06-08 ENCOUNTER — Ambulatory Visit: Payer: Medicaid Other | Admitting: *Deleted

## 2022-06-08 ENCOUNTER — Ambulatory Visit: Payer: Medicaid Other | Attending: Obstetrics

## 2022-06-08 ENCOUNTER — Other Ambulatory Visit: Payer: Self-pay | Admitting: *Deleted

## 2022-06-08 VITALS — BP 103/59 | HR 101

## 2022-06-08 DIAGNOSIS — E669 Obesity, unspecified: Secondary | ICD-10-CM | POA: Diagnosis not present

## 2022-06-08 DIAGNOSIS — Z148 Genetic carrier of other disease: Secondary | ICD-10-CM | POA: Diagnosis not present

## 2022-06-08 DIAGNOSIS — Z3493 Encounter for supervision of normal pregnancy, unspecified, third trimester: Secondary | ICD-10-CM

## 2022-06-08 DIAGNOSIS — Z3A32 32 weeks gestation of pregnancy: Secondary | ICD-10-CM | POA: Insufficient documentation

## 2022-06-08 DIAGNOSIS — O99019 Anemia complicating pregnancy, unspecified trimester: Secondary | ICD-10-CM | POA: Insufficient documentation

## 2022-06-08 DIAGNOSIS — Z362 Encounter for other antenatal screening follow-up: Secondary | ICD-10-CM | POA: Insufficient documentation

## 2022-06-08 DIAGNOSIS — Z3403 Encounter for supervision of normal first pregnancy, third trimester: Secondary | ICD-10-CM

## 2022-06-08 DIAGNOSIS — D573 Sickle-cell trait: Secondary | ICD-10-CM | POA: Diagnosis not present

## 2022-06-08 DIAGNOSIS — O285 Abnormal chromosomal and genetic finding on antenatal screening of mother: Secondary | ICD-10-CM

## 2022-06-08 DIAGNOSIS — O99213 Obesity complicating pregnancy, third trimester: Secondary | ICD-10-CM | POA: Diagnosis not present

## 2022-06-08 DIAGNOSIS — Z3689 Encounter for other specified antenatal screening: Secondary | ICD-10-CM

## 2022-06-14 ENCOUNTER — Ambulatory Visit: Payer: Medicaid Other | Admitting: *Deleted

## 2022-06-14 ENCOUNTER — Ambulatory Visit: Payer: Medicaid Other | Attending: Obstetrics

## 2022-06-14 ENCOUNTER — Encounter: Payer: Self-pay | Admitting: *Deleted

## 2022-06-14 VITALS — BP 122/73 | HR 87

## 2022-06-14 DIAGNOSIS — Z6841 Body Mass Index (BMI) 40.0 and over, adult: Secondary | ICD-10-CM

## 2022-06-14 DIAGNOSIS — Z148 Genetic carrier of other disease: Secondary | ICD-10-CM | POA: Insufficient documentation

## 2022-06-14 DIAGNOSIS — D573 Sickle-cell trait: Secondary | ICD-10-CM | POA: Diagnosis not present

## 2022-06-14 DIAGNOSIS — Z362 Encounter for other antenatal screening follow-up: Secondary | ICD-10-CM | POA: Diagnosis present

## 2022-06-14 DIAGNOSIS — O99213 Obesity complicating pregnancy, third trimester: Secondary | ICD-10-CM | POA: Diagnosis not present

## 2022-06-14 DIAGNOSIS — Z3A32 32 weeks gestation of pregnancy: Secondary | ICD-10-CM | POA: Insufficient documentation

## 2022-06-14 DIAGNOSIS — Z3493 Encounter for supervision of normal pregnancy, unspecified, third trimester: Secondary | ICD-10-CM | POA: Insufficient documentation

## 2022-06-14 DIAGNOSIS — E669 Obesity, unspecified: Secondary | ICD-10-CM | POA: Diagnosis not present

## 2022-06-14 DIAGNOSIS — O99013 Anemia complicating pregnancy, third trimester: Secondary | ICD-10-CM | POA: Diagnosis not present

## 2022-06-14 DIAGNOSIS — Z3689 Encounter for other specified antenatal screening: Secondary | ICD-10-CM | POA: Insufficient documentation

## 2022-06-15 ENCOUNTER — Ambulatory Visit (INDEPENDENT_AMBULATORY_CARE_PROVIDER_SITE_OTHER): Payer: Medicaid Other | Admitting: Obstetrics and Gynecology

## 2022-06-15 ENCOUNTER — Ambulatory Visit: Payer: Medicaid Other | Admitting: Certified Nurse Midwife

## 2022-06-15 ENCOUNTER — Encounter: Payer: Self-pay | Admitting: Obstetrics and Gynecology

## 2022-06-15 VITALS — BP 115/79 | HR 91 | Wt 237.9 lb

## 2022-06-15 DIAGNOSIS — Z3403 Encounter for supervision of normal first pregnancy, third trimester: Secondary | ICD-10-CM

## 2022-06-15 DIAGNOSIS — Z3493 Encounter for supervision of normal pregnancy, unspecified, third trimester: Secondary | ICD-10-CM

## 2022-06-15 DIAGNOSIS — O99213 Obesity complicating pregnancy, third trimester: Secondary | ICD-10-CM

## 2022-06-15 DIAGNOSIS — D573 Sickle-cell trait: Secondary | ICD-10-CM

## 2022-06-15 DIAGNOSIS — Z3A33 33 weeks gestation of pregnancy: Secondary | ICD-10-CM

## 2022-06-15 DIAGNOSIS — Z3A32 32 weeks gestation of pregnancy: Secondary | ICD-10-CM

## 2022-06-15 NOTE — Progress Notes (Signed)
   PRENATAL VISIT NOTE  Subjective:  Sydney Hart is a 21 y.o. G1P0 at [redacted]w[redacted]d being seen today for ongoing prenatal care.  She is currently monitored for the following issues for this low-risk pregnancy and has Migraine without aura and without status migrainosus, not intractable; Tension headache; Supervision of normal first teen pregnancy; Obesity (BMI 30.0-34.9); Sickle-cell trait (HCC); and Obesity affecting pregnancy in third trimester on their problem list.  Patient doing well with no acute concerns today. She reports  continued back pain .  Contractions: Irritability. Vag. Bleeding: None.  Movement: Present. Denies leaking of fluid. Pt is asking to start medical leave now due to possible lower disk issues in her back causing increased pain while working.  The following portions of the patient's history were reviewed and updated as appropriate: allergies, current medications, past family history, past medical history, past social history, past surgical history and problem list. Problem list updated.  Objective:   Vitals:   06/15/22 1459  BP: 115/79  Pulse: 91  Weight: 237 lb 14.4 oz (107.9 kg)    Fetal Status: Fetal Heart Rate (bpm): 157   Movement: Present     General:  Alert, oriented and cooperative. Patient is in no acute distress.  Skin: Skin is warm and dry. No rash noted.   Cardiovascular: Normal heart rate noted  Respiratory: Normal respiratory effort, no problems with respiration noted  Abdomen: Soft, gravid, appropriate for gestational age.  Pain/Pressure: Absent     Pelvic: Cervical exam deferred        Extremities: Normal range of motion.  Edema: None  Mental Status:  Normal mood and affect. Normal behavior. Normal judgment and thought content.   Assessment and Plan:  Pregnancy: G1P0 at [redacted]w[redacted]d  1. Supervision of normal first teen pregnancy in third trimester Continue routine prenatal care  2. Sickle-cell trait (HCC)   3. [redacted] weeks gestation of  pregnancy   4. Obesity affecting pregnancy in third trimester   Preterm labor symptoms and general obstetric precautions including but not limited to vaginal bleeding, contractions, leaking of fluid and fetal movement were reviewed in detail with the patient.  Please refer to After Visit Summary for other counseling recommendations.   Return in about 2 weeks (around 06/29/2022) for ROB, in person.   Mariel Aloe, MD Faculty Attending [facility]

## 2022-06-15 NOTE — Progress Notes (Signed)
Pt presents for ROB visit. No concerns at this time.  

## 2022-06-18 NOTE — Progress Notes (Signed)
Appt canceled!

## 2022-06-29 ENCOUNTER — Ambulatory Visit: Payer: Medicaid Other | Admitting: *Deleted

## 2022-06-29 ENCOUNTER — Ambulatory Visit: Payer: Medicaid Other | Attending: Obstetrics and Gynecology

## 2022-06-29 VITALS — BP 104/56 | HR 114

## 2022-06-29 DIAGNOSIS — Z3403 Encounter for supervision of normal first pregnancy, third trimester: Secondary | ICD-10-CM

## 2022-06-29 DIAGNOSIS — O99213 Obesity complicating pregnancy, third trimester: Secondary | ICD-10-CM | POA: Diagnosis present

## 2022-06-29 DIAGNOSIS — Z3A35 35 weeks gestation of pregnancy: Secondary | ICD-10-CM | POA: Insufficient documentation

## 2022-06-29 DIAGNOSIS — Z3493 Encounter for supervision of normal pregnancy, unspecified, third trimester: Secondary | ICD-10-CM | POA: Insufficient documentation

## 2022-06-29 DIAGNOSIS — Z148 Genetic carrier of other disease: Secondary | ICD-10-CM | POA: Diagnosis not present

## 2022-06-29 DIAGNOSIS — D573 Sickle-cell trait: Secondary | ICD-10-CM | POA: Insufficient documentation

## 2022-06-29 DIAGNOSIS — Z3689 Encounter for other specified antenatal screening: Secondary | ICD-10-CM | POA: Insufficient documentation

## 2022-07-05 ENCOUNTER — Ambulatory Visit (INDEPENDENT_AMBULATORY_CARE_PROVIDER_SITE_OTHER): Payer: Medicaid Other | Admitting: Student

## 2022-07-05 VITALS — BP 114/74 | HR 96 | Wt 243.0 lb

## 2022-07-05 DIAGNOSIS — Z3403 Encounter for supervision of normal first pregnancy, third trimester: Secondary | ICD-10-CM

## 2022-07-05 DIAGNOSIS — M549 Dorsalgia, unspecified: Secondary | ICD-10-CM

## 2022-07-05 DIAGNOSIS — O99213 Obesity complicating pregnancy, third trimester: Secondary | ICD-10-CM

## 2022-07-05 DIAGNOSIS — Z3A35 35 weeks gestation of pregnancy: Secondary | ICD-10-CM

## 2022-07-05 DIAGNOSIS — R252 Cramp and spasm: Secondary | ICD-10-CM

## 2022-07-05 DIAGNOSIS — O99891 Other specified diseases and conditions complicating pregnancy: Secondary | ICD-10-CM

## 2022-07-05 NOTE — Progress Notes (Unsigned)
ROB 35.6 wks Reports frequent "cramps" Reports SOB

## 2022-07-06 ENCOUNTER — Ambulatory Visit: Payer: Medicaid Other | Admitting: *Deleted

## 2022-07-06 ENCOUNTER — Ambulatory Visit: Payer: Medicaid Other | Attending: Obstetrics and Gynecology

## 2022-07-06 VITALS — BP 120/71 | HR 104

## 2022-07-06 DIAGNOSIS — Z148 Genetic carrier of other disease: Secondary | ICD-10-CM | POA: Diagnosis not present

## 2022-07-06 DIAGNOSIS — O285 Abnormal chromosomal and genetic finding on antenatal screening of mother: Secondary | ICD-10-CM | POA: Diagnosis not present

## 2022-07-06 DIAGNOSIS — D573 Sickle-cell trait: Secondary | ICD-10-CM | POA: Diagnosis not present

## 2022-07-06 DIAGNOSIS — Z3A36 36 weeks gestation of pregnancy: Secondary | ICD-10-CM | POA: Insufficient documentation

## 2022-07-06 DIAGNOSIS — Z3403 Encounter for supervision of normal first pregnancy, third trimester: Secondary | ICD-10-CM | POA: Insufficient documentation

## 2022-07-06 DIAGNOSIS — O99213 Obesity complicating pregnancy, third trimester: Secondary | ICD-10-CM | POA: Insufficient documentation

## 2022-07-06 DIAGNOSIS — O358XX0 Maternal care for other (suspected) fetal abnormality and damage, not applicable or unspecified: Secondary | ICD-10-CM

## 2022-07-06 DIAGNOSIS — Z3493 Encounter for supervision of normal pregnancy, unspecified, third trimester: Secondary | ICD-10-CM | POA: Insufficient documentation

## 2022-07-06 DIAGNOSIS — E669 Obesity, unspecified: Secondary | ICD-10-CM

## 2022-07-06 DIAGNOSIS — Z3689 Encounter for other specified antenatal screening: Secondary | ICD-10-CM | POA: Insufficient documentation

## 2022-07-06 MED ORDER — MAGNESIUM OXIDE -MG SUPPLEMENT 200 MG PO TABS: 400.0000 mg | ORAL_TABLET | Freq: Every day | ORAL | 3 refills | Status: AC

## 2022-07-06 NOTE — Progress Notes (Signed)
PRENATAL VISIT NOTE  Subjective:  Sydney Hart is a 21 y.o. G1P0 at [redacted]w[redacted]d being seen today for ongoing prenatal care.  She is currently monitored for the following issues for this low-risk pregnancy and has Migraine without aura and without status migrainosus, not intractable; Tension headache; Supervision of normal first teen pregnancy; Obesity (BMI 30.0-34.9); Sickle-cell trait (HCC); and Obesity affecting pregnancy in third trimester on their problem list.  Patient reports backache and leg pain . Patient states that she had back pain prior to pregnancy and consulted with a spine specialist. Patient is uncertain of their recommended management plan. Contractions: Irritability. Vag. Bleeding: None.  Movement: Present. Denies leaking of fluid.   The following portions of the patient's history were reviewed and updated as appropriate: allergies, current medications, past family history, past medical history, past social history, past surgical history and problem list.   Objective:   Vitals:   07/05/22 1459  BP: 114/74  Pulse: 96  Weight: 243 lb (110.2 kg)    Fetal Status: Fetal Heart Rate (bpm): 140   Movement: Present     General:  Alert, oriented and cooperative. Patient is in no acute distress.  Skin: Skin is warm and dry. No rash noted.   Cardiovascular: Normal heart rate noted  Respiratory: Normal respiratory effort, no problems with respiration noted  Abdomen: Soft, gravid, appropriate for gestational age.  Pain/Pressure: Absent     Pelvic: Cervical exam deferred        Extremities: Normal range of motion.  Edema: None  Mental Status: Normal mood and affect. Normal behavior. Normal judgment and thought content.   Assessment and Plan:  Pregnancy: G1P0 at [redacted]w[redacted]d 1. Supervision of normal first teen pregnancy in third trimester - Doing well, vigorous fetal movement  2. [redacted] weeks gestation of pregnancy - GBS at next visit  3. Obesity affecting pregnancy in third  trimester - F/U [employer] [place] scheduled for tomorrow w/ BPP  4. Back pain affecting pregnancy in third trimester -Chronic back pain. Encouraged heat/ice/Tylenol/support belt to provide comfort in pregnancy. Patient would likely benefit from PT consultation and consider their recommendations for additional follow-up needs - Magnesium Oxide -Mg Supplement (MAG-OXIDE) 200 MG TABS; Take 2 tablets (400 mg total) by mouth at bedtime. If that amount causes loose stools in the am, switch to 200mg  daily at bedtime.  Dispense: 60 tablet; Refill: 3 - Ambulatory referral to Physical Therapy  5. Leg cramps in pregnancy - Magnesium Oxide -Mg Supplement (MAG-OXIDE) 200 MG TABS; Take 2 tablets (400 mg total) by mouth at bedtime. If that amount causes loose stools in the am, switch to 200mg  daily at bedtime.  Dispense: 60 tablet; Refill: 3  Preterm labor symptoms and general obstetric precautions including but not limited to vaginal bleeding, contractions, leaking of fluid and fetal movement were reviewed in detail with the patient. Please refer to After Visit Summary for other counseling recommendations.   Return in about 6 days (around 07/11/2022) for LOB/GBS, IN-PERSON/ with a midwife.  Future Appointments  Date Time Provider Department Center  07/12/2022  3:30 PM 09/10/2022, NP [facility] None  07/13/2022 12:30 PM [facility] NURSE [facility]  07/13/2022 12:45 PM [facility] US[facility]  07/17/2022  1:50 PM [facility], [facility] CNM [facility] None  07/20/2022 12:30 PM [facility] NURSE [facility]  07/20/2022 12:45 PM [facility]  07/26/2022 11:15 AM [facility] NP [facility] None  07/27/2022 12:30 PM [facility] NURSE [facility]  07/27/2022 12:45 PM [facility] US4 [facility]  [facility]  08/03/2022  1:50 PM Raelyn Mora, CNM [facility] None    Corlis Hove, NP

## 2022-07-12 ENCOUNTER — Ambulatory Visit (INDEPENDENT_AMBULATORY_CARE_PROVIDER_SITE_OTHER): Payer: Medicaid Other | Admitting: Student

## 2022-07-12 ENCOUNTER — Other Ambulatory Visit (HOSPITAL_COMMUNITY)
Admission: RE | Admit: 2022-07-12 | Discharge: 2022-07-12 | Disposition: A | Discharge status: Home / Self Care | Payer: Medicaid Other | Source: Ambulatory Visit | Attending: Student | Admitting: Student

## 2022-07-12 VITALS — BP 126/84 | HR 95 | Wt 244.2 lb

## 2022-07-12 DIAGNOSIS — O26893 Other specified pregnancy related conditions, third trimester: Secondary | ICD-10-CM | POA: Insufficient documentation

## 2022-07-12 DIAGNOSIS — O99891 Other specified diseases and conditions complicating pregnancy: Secondary | ICD-10-CM

## 2022-07-12 DIAGNOSIS — Z3A36 36 weeks gestation of pregnancy: Secondary | ICD-10-CM | POA: Insufficient documentation

## 2022-07-12 DIAGNOSIS — O99213 Obesity complicating pregnancy, third trimester: Secondary | ICD-10-CM

## 2022-07-12 DIAGNOSIS — M549 Dorsalgia, unspecified: Secondary | ICD-10-CM

## 2022-07-12 DIAGNOSIS — Z3403 Encounter for supervision of normal first pregnancy, third trimester: Secondary | ICD-10-CM

## 2022-07-12 DIAGNOSIS — O99820 Streptococcus B carrier state complicating pregnancy: Secondary | ICD-10-CM | POA: Insufficient documentation

## 2022-07-12 NOTE — Progress Notes (Signed)
ROB 36.[redacted] wks GA GBS, GC/CC today

## 2022-07-12 NOTE — Progress Notes (Signed)
   PRENATAL VISIT NOTE  Subjective:  Sydney Hart is a 21 y.o. G1P0 at [redacted]w[redacted]d being seen today for ongoing prenatal care.  She is currently monitored for the following issues for this low-risk pregnancy and has Migraine without aura and without status migrainosus, not intractable; Tension headache; Supervision of normal first teen pregnancy; Obesity (BMI 30.0-34.9); Sickle-cell trait (HCC); and Obesity affecting pregnancy in third trimester on their problem list.  Patient reports backache.  Contractions: Irritability. Vag. Bleeding: None.  Movement: Present. Denies leaking of fluid.   The following portions of the patient's history were reviewed and updated as appropriate: allergies, current medications, past family history, past medical history, past social history, past surgical history and problem list.   Objective:   Vitals:   07/12/22 1538  BP: 126/84  Pulse: 95  Weight: 244 lb 3.2 oz (110.8 kg)    Fetal Status: Fetal Heart Rate (bpm): 135 Fundal Height: 37 cm Movement: Present     General:  Alert, oriented and cooperative. Patient is in no acute distress.  Skin: Skin is warm and dry. No rash noted.   Cardiovascular: Normal heart rate noted  Respiratory: Normal respiratory effort, no problems with respiration noted  Abdomen: Soft, gravid, appropriate for gestational age.  Pain/Pressure: Absent     Pelvic: Cervical exam deferred        Extremities: Normal range of motion.  Edema: None  Mental Status: Normal mood and affect. Normal behavior. Normal judgment and thought content.   Assessment and Plan:  Pregnancy: G1P0 at [redacted]w[redacted]d 1. Supervision of normal first teen pregnancy in third trimester - Doing well, feels frequent and vigorous fetal movement  2. [redacted] weeks gestation of pregnancy - Strep Gp B NAA - Cervicovaginal ancillary only( [place])  3. Obesity affecting pregnancy in third trimester - BMI 40 - Weekly BPPs scheduled, next one tomorrow  4. Back pain  affecting pregnancy in third trimester - scheduled for therapy at [facility] tomorrow  Preterm labor symptoms and general obstetric precautions including but not limited to vaginal bleeding, contractions, leaking of fluid and fetal movement were reviewed in detail with the patient. Please refer to After Visit Summary for other counseling recommendations.   No follow-ups on file.  Future Appointments  Date Time Provider Department Center  07/13/2022 10:15 AM Levie Heritage, DO [facility] None  07/13/2022 12:30 PM [facility] NURSE [facility]  07/13/2022 12:45 PM [facility]  07/17/2022  1:50 PM Leftwich-Kirby, Wilmer Floor, CNM [facility] None  07/20/2022 12:30 PM [facility] NURSE [facility]  07/20/2022 12:45 PM [facility] US4 [facility]  07/26/2022 11:15 AM Corlis Hove, NP [facility] None  07/27/2022 12:30 PM [facility] NURSE [facility]  07/27/2022 12:45 PM [facility] US4 [facility]  08/03/2022  1:50 PM Raelyn Mora, CNM [facility] None    Corlis Hove, NP

## 2022-07-13 ENCOUNTER — Ambulatory Visit: Payer: Medicaid Other | Admitting: *Deleted

## 2022-07-13 ENCOUNTER — Ambulatory Visit (INDEPENDENT_AMBULATORY_CARE_PROVIDER_SITE_OTHER): Payer: Medicaid Other | Admitting: Family Medicine

## 2022-07-13 ENCOUNTER — Ambulatory Visit: Payer: Medicaid Other | Attending: Obstetrics and Gynecology

## 2022-07-13 VITALS — BP 117/70 | HR 93

## 2022-07-13 VITALS — BP 121/73 | HR 80 | Wt 247.0 lb

## 2022-07-13 DIAGNOSIS — Z148 Genetic carrier of other disease: Secondary | ICD-10-CM | POA: Diagnosis not present

## 2022-07-13 DIAGNOSIS — O99213 Obesity complicating pregnancy, third trimester: Secondary | ICD-10-CM | POA: Diagnosis not present

## 2022-07-13 DIAGNOSIS — M5459 Other low back pain: Secondary | ICD-10-CM | POA: Diagnosis not present

## 2022-07-13 DIAGNOSIS — M9905 Segmental and somatic dysfunction of pelvic region: Secondary | ICD-10-CM

## 2022-07-13 DIAGNOSIS — Z3A37 37 weeks gestation of pregnancy: Secondary | ICD-10-CM | POA: Diagnosis not present

## 2022-07-13 DIAGNOSIS — M9903 Segmental and somatic dysfunction of lumbar region: Secondary | ICD-10-CM

## 2022-07-13 DIAGNOSIS — Z6841 Body Mass Index (BMI) 40.0 and over, adult: Secondary | ICD-10-CM

## 2022-07-13 DIAGNOSIS — O99891 Other specified diseases and conditions complicating pregnancy: Secondary | ICD-10-CM

## 2022-07-13 DIAGNOSIS — Z3493 Encounter for supervision of normal pregnancy, unspecified, third trimester: Secondary | ICD-10-CM | POA: Insufficient documentation

## 2022-07-13 DIAGNOSIS — D573 Sickle-cell trait: Secondary | ICD-10-CM

## 2022-07-13 DIAGNOSIS — Z3689 Encounter for other specified antenatal screening: Secondary | ICD-10-CM | POA: Insufficient documentation

## 2022-07-13 DIAGNOSIS — E669 Obesity, unspecified: Secondary | ICD-10-CM | POA: Diagnosis not present

## 2022-07-13 DIAGNOSIS — M549 Dorsalgia, unspecified: Secondary | ICD-10-CM

## 2022-07-13 DIAGNOSIS — Z3403 Encounter for supervision of normal first pregnancy, third trimester: Secondary | ICD-10-CM

## 2022-07-13 DIAGNOSIS — O285 Abnormal chromosomal and genetic finding on antenatal screening of mother: Secondary | ICD-10-CM

## 2022-07-13 DIAGNOSIS — M9902 Segmental and somatic dysfunction of thoracic region: Secondary | ICD-10-CM

## 2022-07-13 DIAGNOSIS — M9904 Segmental and somatic dysfunction of sacral region: Secondary | ICD-10-CM

## 2022-07-13 DIAGNOSIS — M9908 Segmental and somatic dysfunction of rib cage: Secondary | ICD-10-CM

## 2022-07-13 LAB — CERVICOVAGINAL ANCILLARY ONLY
Chlamydia: NEGATIVE
Comment: NEGATIVE
Comment: NORMAL
Neisseria Gonorrhea: NEGATIVE
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
## 2022-07-14 LAB — STREP GP B NAA: Strep Gp B NAA: POSITIVE — AB

## 2022-07-17 ENCOUNTER — Ambulatory Visit (INDEPENDENT_AMBULATORY_CARE_PROVIDER_SITE_OTHER): Payer: Medicaid Other | Admitting: Advanced Practice Midwife

## 2022-07-17 ENCOUNTER — Encounter: Payer: Self-pay | Admitting: Advanced Practice Midwife

## 2022-07-17 VITALS — BP 133/79 | HR 90 | Wt 244.1 lb

## 2022-07-17 DIAGNOSIS — Z3403 Encounter for supervision of normal first pregnancy, third trimester: Secondary | ICD-10-CM

## 2022-07-17 DIAGNOSIS — O99891 Other specified diseases and conditions complicating pregnancy: Secondary | ICD-10-CM

## 2022-07-17 DIAGNOSIS — M549 Dorsalgia, unspecified: Secondary | ICD-10-CM

## 2022-07-17 DIAGNOSIS — Z3A37 37 weeks gestation of pregnancy: Secondary | ICD-10-CM

## 2022-07-17 DIAGNOSIS — D573 Sickle-cell trait: Secondary | ICD-10-CM

## 2022-07-17 NOTE — Patient Instructions (Signed)

## 2022-07-17 NOTE — Progress Notes (Signed)
   PRENATAL VISIT NOTE  Subjective:  Sydney Hart is a 21 y.o. G1P0 at [redacted]w[redacted]d being seen today for ongoing prenatal care.  She is currently monitored for the following issues for this {Blank single:19197::"high-risk","low-risk"} pregnancy and has Migraine without aura and without status migrainosus, not intractable; Tension headache; Supervision of normal first teen pregnancy; Obesity (BMI 30.0-34.9); Sickle-cell trait (HCC); and Obesity affecting pregnancy in third trimester on their problem list.  Patient reports {sx:14538}.  Contractions: Irritability. Vag. Bleeding: None.  Movement: Present. Denies leaking of fluid.   The following portions of the patient's history were reviewed and updated as appropriate: allergies, current medications, past family history, past medical history, past social history, past surgical history and problem list.   Objective:   Vitals:   07/17/22 1403  BP: 133/79  Pulse: 90  Weight: 244 lb 1.6 oz (110.7 kg)    Fetal Status: Fetal Heart Rate (bpm): 141   Movement: Present     General:  Alert, oriented and cooperative. Patient is in no acute distress.  Skin: Skin is warm and dry. No rash noted.   Cardiovascular: Normal heart rate noted  Respiratory: Normal respiratory effort, no problems with respiration noted  Abdomen: Soft, gravid, appropriate for gestational age.  Pain/Pressure: Present     Pelvic: {Blank single:19197::"Cervical exam performed in the presence of a chaperone","Cervical exam deferred"}        Extremities: Normal range of motion.  Edema: None  Mental Status: Normal mood and affect. Normal behavior. Normal judgment and thought content.   Assessment and Plan:  Pregnancy: G1P0 at [redacted]w[redacted]d 1. Supervision of normal first teen pregnancy in third trimester ***  2. Back pain affecting pregnancy in third trimester ***  3. Sickle-cell trait (HCC) ***  4. [redacted] weeks gestation of pregnancy ***  {Blank single:19197::"Term","Preterm"}  labor symptoms and general obstetric precautions including but not limited to vaginal bleeding, contractions, leaking of fluid and fetal movement were reviewed in detail with the patient. Please refer to After Visit Summary for other counseling recommendations.   No follow-ups on file.  Future Appointments  Date Time Provider Department Center  07/20/2022 12:30 PM [facility] NURSE [facility]  07/20/2022 12:45 PM [facility] US4 [facility]  07/26/2022 11:15 AM Corlis Hove, NP [facility] None  07/27/2022  9:35 AM Levie Heritage, DO [facility] None  07/27/2022 12:30 PM [facility] NURSE [facility]  07/27/2022 12:45 PM [facility] US4 [facility]  08/03/2022  1:50 PM Raelyn Mora, CNM [facility] None    Sharen Counter, CNM

## 2022-07-20 ENCOUNTER — Ambulatory Visit: Payer: Medicaid Other | Attending: Obstetrics and Gynecology

## 2022-07-20 ENCOUNTER — Ambulatory Visit: Payer: Medicaid Other | Admitting: *Deleted

## 2022-07-20 VITALS — BP 103/83 | HR 97

## 2022-07-20 DIAGNOSIS — D573 Sickle-cell trait: Secondary | ICD-10-CM | POA: Diagnosis not present

## 2022-07-20 DIAGNOSIS — Z6841 Body Mass Index (BMI) 40.0 and over, adult: Secondary | ICD-10-CM

## 2022-07-20 DIAGNOSIS — O285 Abnormal chromosomal and genetic finding on antenatal screening of mother: Secondary | ICD-10-CM | POA: Diagnosis not present

## 2022-07-20 DIAGNOSIS — E669 Obesity, unspecified: Secondary | ICD-10-CM

## 2022-07-20 DIAGNOSIS — Z148 Genetic carrier of other disease: Secondary | ICD-10-CM | POA: Diagnosis not present

## 2022-07-20 DIAGNOSIS — Z3A38 38 weeks gestation of pregnancy: Secondary | ICD-10-CM | POA: Insufficient documentation

## 2022-07-20 DIAGNOSIS — O99213 Obesity complicating pregnancy, third trimester: Secondary | ICD-10-CM | POA: Insufficient documentation

## 2022-07-20 DIAGNOSIS — Z3689 Encounter for other specified antenatal screening: Secondary | ICD-10-CM | POA: Insufficient documentation

## 2022-07-20 DIAGNOSIS — Z3493 Encounter for supervision of normal pregnancy, unspecified, third trimester: Secondary | ICD-10-CM | POA: Insufficient documentation

## 2022-07-21 ENCOUNTER — Encounter: Payer: Self-pay | Admitting: Student

## 2022-07-26 ENCOUNTER — Encounter: Payer: Medicaid Other | Admitting: Student

## 2022-07-27 ENCOUNTER — Ambulatory Visit (INDEPENDENT_AMBULATORY_CARE_PROVIDER_SITE_OTHER): Payer: Medicaid Other | Admitting: Family Medicine

## 2022-07-27 ENCOUNTER — Ambulatory Visit: Payer: Medicaid Other | Admitting: *Deleted

## 2022-07-27 ENCOUNTER — Ambulatory Visit: Payer: Medicaid Other | Attending: Obstetrics and Gynecology

## 2022-07-27 VITALS — BP 119/68 | HR 98

## 2022-07-27 VITALS — BP 102/80 | HR 124 | Wt 246.0 lb

## 2022-07-27 DIAGNOSIS — M9900 Segmental and somatic dysfunction of head region: Secondary | ICD-10-CM

## 2022-07-27 DIAGNOSIS — O285 Abnormal chromosomal and genetic finding on antenatal screening of mother: Secondary | ICD-10-CM | POA: Diagnosis not present

## 2022-07-27 DIAGNOSIS — Z3A39 39 weeks gestation of pregnancy: Secondary | ICD-10-CM | POA: Diagnosis not present

## 2022-07-27 DIAGNOSIS — Z3493 Encounter for supervision of normal pregnancy, unspecified, third trimester: Secondary | ICD-10-CM | POA: Diagnosis present

## 2022-07-27 DIAGNOSIS — E669 Obesity, unspecified: Secondary | ICD-10-CM | POA: Diagnosis not present

## 2022-07-27 DIAGNOSIS — M9908 Segmental and somatic dysfunction of rib cage: Secondary | ICD-10-CM | POA: Diagnosis not present

## 2022-07-27 DIAGNOSIS — Z3689 Encounter for other specified antenatal screening: Secondary | ICD-10-CM | POA: Diagnosis present

## 2022-07-27 DIAGNOSIS — O99013 Anemia complicating pregnancy, third trimester: Secondary | ICD-10-CM | POA: Diagnosis not present

## 2022-07-27 DIAGNOSIS — D573 Sickle-cell trait: Secondary | ICD-10-CM | POA: Diagnosis not present

## 2022-07-27 DIAGNOSIS — Z6841 Body Mass Index (BMI) 40.0 and over, adult: Secondary | ICD-10-CM | POA: Diagnosis present

## 2022-07-27 DIAGNOSIS — M9901 Segmental and somatic dysfunction of cervical region: Secondary | ICD-10-CM | POA: Diagnosis not present

## 2022-07-27 DIAGNOSIS — D571 Sickle-cell disease without crisis: Secondary | ICD-10-CM | POA: Diagnosis not present

## 2022-07-27 DIAGNOSIS — M549 Dorsalgia, unspecified: Secondary | ICD-10-CM

## 2022-07-27 DIAGNOSIS — M9902 Segmental and somatic dysfunction of thoracic region: Secondary | ICD-10-CM

## 2022-07-27 DIAGNOSIS — O99891 Other specified diseases and conditions complicating pregnancy: Secondary | ICD-10-CM

## 2022-07-27 DIAGNOSIS — O99213 Obesity complicating pregnancy, third trimester: Secondary | ICD-10-CM

## 2022-07-27 DIAGNOSIS — O99820 Streptococcus B carrier state complicating pregnancy: Secondary | ICD-10-CM

## 2022-07-27 DIAGNOSIS — Z3403 Encounter for supervision of normal first pregnancy, third trimester: Secondary | ICD-10-CM

## 2022-07-27 NOTE — Progress Notes (Signed)
   PRENATAL VISIT NOTE  Subjective:  Sydney Hart is a 21 y.o. G1P0 at [redacted]w[redacted]d being seen today for ongoing prenatal care.  She is currently monitored for the following issues for this low-risk pregnancy and has Migraine without aura and without status migrainosus, not intractable; Tension headache; Supervision of normal first teen pregnancy; Obesity (BMI 30.0-34.9); Sickle-cell trait (HCC); Obesity affecting pregnancy in third trimester; and GBS (group B Streptococcus carrier), +RV culture, currently pregnant on their problem list.  Patient reports backache - pain in upper back improved. Pain in lumbar spine remains the same. Feels like her lumbar back is tight. Not sure how much of it is her chronic pain and how much is pain from pregnancy. Contractions: Irregular. Vag. Bleeding: None.  Movement: Present. Denies leaking of fluid.   The following portions of the patient's history were reviewed and updated as appropriate: allergies, current medications, past family history, past medical history, past social history, past surgical history and problem list. Problem list updated.  Objective:   Vitals:   07/27/22 1039  BP: 102/80  Pulse: (!) 124  Weight: 246 lb (111.6 kg)    Fetal Status: Fetal Heart Rate (bpm): 136   Movement: Present     General:  Alert, oriented and cooperative. Patient is in no acute distress.  Skin: Skin is warm and dry. No rash noted.   Cardiovascular: Normal heart rate noted  Respiratory: Normal respiratory effort, no problems with respiration noted  Abdomen: Soft, gravid, appropriate for gestational age. Pain/Pressure: Present     Pelvic:  Cervical exam deferred        MSK: Restriction, tenderness, tissue texture changes, and paraspinal spasm in the lumbar and thoracic spine  Neuro: Moves all four extremities with no focal neurological deficit  Extremities: Normal range of motion.  Edema: None  Mental Status: Normal mood and affect. Normal behavior. Normal  judgment and thought content.   OSE: Head SRRL  Cervical C6 SRR  Thoracic T4-5 FSRR  Rib Rib 4-5 inhalved  Lumbar   Sacrum   Pelvis     Assessment and Plan:  Pregnancy: G1P0 at [redacted]w[redacted]d 1. [redacted] weeks gestation of pregnancy  2. Supervision of normal first teen pregnancy in third trimester  3. GBS (group B Streptococcus carrier), +RV culture, currently pregnant Intrapartum PPX.  4. Obesity (BMI 30.0-34.9)  5. Back pain affecting pregnancy in third trimester 6. Somatic dysfunction of head region 7. Somatic dysfunction of spine, cervical 8. Somatic dysfunction of spine, thoracic 9. Somatic dysfunction of rib Will concentrate on thoracic and cervical spine for now.  OMT done after patient permission. HVLA technique utilized. 4 areas treated with improvement of tissue texture and joint mobility. Patient tolerated procedure well.     Term labor symptoms and general obstetric precautions including but not limited to vaginal bleeding, contractions, leaking of fluid and fetal movement were reviewed in detail with the patient. Please refer to After Visit Summary for other counseling recommendations.  No follow-ups on file.  Levie Heritage, DO

## 2022-08-03 ENCOUNTER — Ambulatory Visit (INDEPENDENT_AMBULATORY_CARE_PROVIDER_SITE_OTHER): Payer: Medicaid Other | Admitting: Obstetrics and Gynecology

## 2022-08-03 ENCOUNTER — Encounter: Payer: Self-pay | Admitting: Obstetrics and Gynecology

## 2022-08-03 ENCOUNTER — Other Ambulatory Visit: Payer: Self-pay | Admitting: Obstetrics and Gynecology

## 2022-08-03 VITALS — BP 122/81 | HR 100 | Wt 246.6 lb

## 2022-08-03 DIAGNOSIS — O99820 Streptococcus B carrier state complicating pregnancy: Secondary | ICD-10-CM

## 2022-08-03 DIAGNOSIS — D573 Sickle-cell trait: Secondary | ICD-10-CM

## 2022-08-03 DIAGNOSIS — Z3A40 40 weeks gestation of pregnancy: Secondary | ICD-10-CM | POA: Diagnosis not present

## 2022-08-03 DIAGNOSIS — O480 Post-term pregnancy: Secondary | ICD-10-CM

## 2022-08-03 DIAGNOSIS — Z3403 Encounter for supervision of normal first pregnancy, third trimester: Secondary | ICD-10-CM

## 2022-08-03 NOTE — Progress Notes (Signed)
   LOW-RISK PREGNANCY OFFICE VISIT Patient name: Sydney Hart MRN [id number]  Date of birth: 2001/03/29 Chief Complaint:   Routine Prenatal Visit  History of Present Illness:   Sydney Hart is a 21 y.o. G1P0 female at [redacted]w[redacted]d with an Estimated Date of Delivery: 08/03/22 being seen today for ongoing management of a low-risk pregnancy.  Today she reports backache, occasional contractions, and "tired and ready for this baby to just be born." . Contractions: Irregular. Vag. Bleeding: None.  Movement: Present. denies leaking of fluid. Review of Systems:   Pertinent items are noted in HPI Denies abnormal vaginal discharge w/ itching/odor/irritation, headaches, visual changes, shortness of breath, chest pain, abdominal pain, severe nausea/vomiting, or problems with urination or bowel movements unless otherwise stated above. Pertinent History Reviewed:  Reviewed past medical,surgical, social, obstetrical and family history.  Reviewed problem list, medications and allergies. Physical Assessment:   Vitals:   08/03/22 1401  BP: 122/81  Pulse: 100  Weight: 246 lb 9.6 oz (111.9 kg)  Body mass index is 43.68 kg/m.        Physical Examination:   General appearance: Well appearing, and in no distress  Mental status: Alert, oriented to person, place, and time  Skin: Warm & dry  Cardiovascular: Normal heart rate noted  Respiratory: Normal respiratory effort, no distress  Abdomen: Soft, gravid, nontender  Pelvic: Cervical exam performed         Extremities: Edema: None  Fetal Status: Fetal Heart Rate (bpm): 133   Movement: Present    No results found for this or any previous visit (from the past 24 hour(s)).  Assessment & Plan:  1) Low-risk pregnancy G1P0 at [redacted]w[redacted]d with an Estimated Date of Delivery: 08/03/22   2) Supervision of normal first teen pregnancy in third trimester - Reactive NST - Information provided on signs of labor - IOL scheduled for Monday 08/07/2022 @ 0745  IOL  orders entered  3) GBS (group B Streptococcus carrier), +RV culture, currently pregnant  - Anticipatory guidance for IP abx   Meds: No orders of the defined types were placed in this encounter.  Labs/procedures today: NST  Plan:  Continue routine obstetrical care   Reviewed: Term labor symptoms and general obstetric precautions including but not limited to vaginal bleeding, contractions, leaking of fluid and fetal movement were reviewed in detail with the patient.  All questions were answered. Has home bp cuff. Check bp weekly, let us know if >140/90.   Follow-up: Return in about 1 week (around 08/10/2022).  No orders of the defined types were placed in this encounter.  Raelyn Mora MSN, CNM 08/03/2022 2:21 PM

## 2022-08-03 NOTE — Patient Instructions (Addendum)
Labor Induction Labor induction is when steps are taken to cause a pregnant woman to begin the labor process. Most women go into labor on their own between 37 weeks and 42 weeks of pregnancy. When this does not happen, or when there is a medical need for labor to begin, steps may be taken to induce, or bring on, labor. Labor induction causes a pregnant woman's uterus to contract. It also causes the cervix to soften (ripen), open (dilate), and thin out. Usually, labor is not induced before 39 weeks of pregnancy unless there is a medical reason to do so. When is labor induction considered? Labor induction may be right for you if: Your pregnancy lasts longer than 41 to 42 weeks. Your placenta is separating from your uterus (placental abruption). You have a rupture of membranes and your labor does not begin. You have health problems, like diabetes or high blood pressure (preeclampsia) during your pregnancy. Your baby has stopped growing or does not have enough amniotic fluid. Before labor induction begins, your health care provider will consider the following factors: Your medical condition and the baby's condition. How many weeks you have been pregnant. How mature the baby's lungs are. The condition of your cervix. The position of the baby. The size of your birth canal. Tell a health care provider about: Any allergies you have. All medicines you are taking, including vitamins, herbs, eye drops, creams, and over-the-counter medicines. Any problems you or your family members have had with anesthetic medicines. Any surgeries you have had. Any blood disorders you have. Any medical conditions you have. What are the risks? Generally, this is a safe procedure. However, problems may occur, including: Failed induction. Changes in fetal heart rate, such as being too high, too low, or irregular (erratic). Infection in the mother or the baby. Increased risk of having a cesarean delivery. Breaking off  (abruption) of the placenta from the uterus. This is rare. Rupture of the uterus. This is very rare. Your baby could fail to get enough blood flow or oxygen. This can be life-threatening. When induction is needed for medical reasons, the benefits generally outweigh the risks. What happens during the procedure? During the procedure, your health care provider will use one of these methods to induce labor: Stripping the membranes. In this method, the amniotic sac tissue is gently separated from the cervix. This causes the following to happen: Your cervix stretches, which in turn causes the release of prostaglandins. Prostaglandins induce labor and cause the uterus to contract. This procedure is often done in an office visit. You will be sent home to wait for contractions to begin. Prostaglandin medicine. This medicine starts contractions and causes the cervix to dilate and ripen. This can be taken by mouth (orally) or by being inserted into the vagina (suppository). Inserting a small, thin tube (catheter) with a balloon into the vagina and then expanding the balloon with water to dilate the cervix. Breaking the water. In this method, a small instrument is used to make a small hole in the amniotic sac. This eventually causes the amniotic sac to break. Contractions should begin within a few hours. Medicine to trigger or strengthen contractions. This medicine is given through an IV that is inserted into a vein in your arm. This procedure may vary among health care providers and hospitals. Where to find more information March of Dimes: www.marchofdimes.org The American College of Obstetricians and Gynecologists: www.acog.org Summary Labor induction causes a pregnant woman's uterus to contract. It also causes the cervix   to soften (ripen), open (dilate), and thin out. Labor is usually not induced before 39 weeks of pregnancy unless there is a medical reason to do so. When induction is needed for medical  reasons, the benefits generally outweigh the risks. Talk with your health care provider about which methods of labor induction are right for you. This information is not intended to replace advice given to you by your health care provider. Make sure you discuss any questions you have with your health care provider. Document Revised: 09/09/2020 Document Reviewed: 09/09/2020 Elsevier Patient Education  2023 [employer] Inc. Balloon Catheter Placement for Cervical Ripening Balloon catheter placement for cervical ripening is a procedure to help your cervix start to open (dilate) for birth. During this procedure, a thin tube (catheter) is placed through your cervix. A tiny balloon attached to the catheter is inflated with water and helps your cervix start to dilate. This procedure is done to prepare your body to induce, or bring on, labor. Cervical ripening with a balloon catheter can make labor induction shorter and easier. You may have this procedure if: Your cervix is not ready for labor. Your health care provider has planned labor induction. You are not having twins or multiples. Your baby is in the head-down position. You do not have any other pregnancy complications that require you to be monitored in the hospital after balloon catheter placement. If your health care provider has recommended labor induction to stimulate a vaginal birth, this procedure may be started the day before induction. You may go home with the balloon in place and return to the hospital to start induction in 12-24 hours. Or you may have this procedure and stay in the hospital so that your progress can be monitored. Tell a health care provider about: Any allergies you have. All medicines you are taking, including vitamins, herbs, eye drops, creams, and over-the-counter medicines. Any blood disorders you have. Any surgeries you have had. Any medical conditions you have. What are the risks? Generally, this is a safe procedure.  However, problems may occur, including: Infection. Bleeding. Cramping or pain. Difficulty passing urine. The baby moving from the head-down position to a position with the feet or buttocks down (breech position). What happens before the procedure? Your health care provider may check your baby's heartbeat (fetal monitoring) before the procedure. You may be asked to empty your bladder. What happens during the procedure?  You will be positioned on the exam table as if you were having a pelvic exam or Pap test. Your health care provider may insert a medical instrument into your vagina (speculum) to see your cervix. Your cervix may be cleaned with a germ-killing solution. The catheter will be inserted through the opening of your cervix. A balloon on the end of the catheter will be inflated with germ-free (sterile) water. Some catheters have two balloons, one on each side of the cervix. Depending on the type of balloon catheter, the end of the catheter may be left free outside your cervix or taped to your leg. The procedure may vary among health care providers and hospitals. What can I expect after the procedure? After the procedure, it is common to have: A feeling of pressure inside your vagina. Light vaginal bleeding (spotting). You may have fetal monitoring before going home. You may be sent home with the catheter in place and asked to return to start your labor induction in about 12-24 hours. Follow these instructions at home: Take over-the-counter and prescription medicines only as told by  your health care provider. Do not leave home until you return to the hospital for labor induction. Return to your normal activities at home as told by your health care provider. Ask your health care provider what activities are safe for you. Do not take baths, swim, or use a hot tub unless your health care provider approves. You may shower at home. As your cervix dilates, your catheter and balloon may  fall out before you return for labor induction. Ask your health care provider what you should do if this happens. You will need to return to start induction as told by your health care provider. Keep all follow-up visits. This is important. Contact your health care provider if: You have chills or a fever. You have constant pain or cramps that are not contractions. You have trouble passing urine. Your water breaks. You have vaginal bleeding that is heavier than spotting. You have contractions that start to last longer and come closer together (about every 5 minutes). The balloon catheter falls out before you return to start your induction. Summary Cervical ripening with a balloon catheter helps your cervix start to open (dilate) for birth. Pressure from the balloon will cause ripening of your cervix. You may go home with the balloon in place and return to start induction in 12-24 hours, or you may stay in the hospital while the balloon is in place. Follow your health care provider's instructions for when to call for help or return to the hospital. This information is not intended to replace advice given to you by your health care provider. Make sure you discuss any questions you have with your health care provider. Document Revised: 09/09/2020 Document Reviewed: 09/09/2020 Elsevier Patient Education  2023 [employer]. Labor Induction Labor induction is when steps are taken to cause a pregnant woman to begin the labor process. Most women go into labor on their own between 37 weeks and 42 weeks of pregnancy. When this does not happen, or when there is a medical need for labor to begin, steps may be taken to induce, or bring on, labor. Labor induction causes a pregnant woman's uterus to contract. It also causes the cervix to soften (ripen), open (dilate), and thin out. Usually, labor is not induced before 39 weeks of pregnancy unless there is a medical reason to do so. When is labor induction  considered? Labor induction may be right for you if: Your pregnancy lasts longer than 41 to 42 weeks. Your placenta is separating from your uterus (placental abruption). You have a rupture of membranes and your labor does not begin. You have health problems, like diabetes or high blood pressure (preeclampsia) during your pregnancy. Your baby has stopped growing or does not have enough amniotic fluid. Before labor induction begins, your health care provider will consider the following factors: Your medical condition and the baby's condition. How many weeks you have been pregnant. How mature the baby's lungs are. The condition of your cervix. The position of the baby. The size of your birth canal. Tell a health care provider about: Any allergies you have. All medicines you are taking, including vitamins, herbs, eye drops, creams, and over-the-counter medicines. Any problems you or your family members have had with anesthetic medicines. Any surgeries you have had. Any blood disorders you have. Any medical conditions you have. What are the risks? Generally, this is a safe procedure. However, problems may occur, including: Failed induction. Changes in fetal heart rate, such as being too high, too low, or irregular (  erratic). Infection in the mother or the baby. Increased risk of having a cesarean delivery. Breaking off (abruption) of the placenta from the uterus. This is rare. Rupture of the uterus. This is very rare. Your baby could fail to get enough blood flow or oxygen. This can be life-threatening. When induction is needed for medical reasons, the benefits generally outweigh the risks. What happens during the procedure? During the procedure, your health care provider will use one of these methods to induce labor: Stripping the membranes. In this method, the amniotic sac tissue is gently separated from the cervix. This causes the following to happen: Your cervix stretches, which in  turn causes the release of prostaglandins. Prostaglandins induce labor and cause the uterus to contract. This procedure is often done in an office visit. You will be sent home to wait for contractions to begin. Prostaglandin medicine. This medicine starts contractions and causes the cervix to dilate and ripen. This can be taken by mouth (orally) or by being inserted into the vagina (suppository). Inserting a small, thin tube (catheter) with a balloon into the vagina and then expanding the balloon with water to dilate the cervix. Breaking the water. In this method, a small instrument is used to make a small hole in the amniotic sac. This eventually causes the amniotic sac to break. Contractions should begin within a few hours. Medicine to trigger or strengthen contractions. This medicine is given through an IV that is inserted into a vein in your arm. This procedure may vary among health care providers and hospitals. Where to find more information March of Dimes: www.marchofdimes.org The [employer] of Obstetricians and Gynecologists: www.acog.org Summary Labor induction causes a pregnant woman's uterus to contract. It also causes the cervix to soften (ripen), open (dilate), and thin out. Labor is usually not induced before 39 weeks of pregnancy unless there is a medical reason to do so. When induction is needed for medical reasons, the benefits generally outweigh the risks. Talk with your health care provider about which methods of labor induction are right for you. This information is not intended to replace advice given to you by your health care provider. Make sure you discuss any questions you have with your health care provider. Document Revised: 09/09/2020 Document Reviewed: 09/09/2020 Elsevier Patient Education  2023 [employer].

## 2022-08-04 ENCOUNTER — Telehealth (HOSPITAL_COMMUNITY): Payer: Self-pay | Admitting: *Deleted

## 2022-08-04 NOTE — Telephone Encounter (Signed)
Preadmission screen  

## 2022-08-05 ENCOUNTER — Other Ambulatory Visit: Payer: Self-pay | Admitting: Advanced Practice Midwife

## 2022-08-07 ENCOUNTER — Other Ambulatory Visit: Payer: Self-pay

## 2022-08-07 ENCOUNTER — Inpatient Hospital Stay (HOSPITAL_COMMUNITY): Payer: Medicaid Other

## 2022-08-07 ENCOUNTER — Encounter (HOSPITAL_COMMUNITY): Payer: Self-pay | Admitting: Family Medicine

## 2022-08-07 ENCOUNTER — Inpatient Hospital Stay (HOSPITAL_COMMUNITY)
Admission: AD | Admit: 2022-08-07 | Discharge: 2022-08-11 | DRG: 807 | Disposition: A | Discharge status: Home / Self Care | Payer: Medicaid Other | Attending: Family Medicine | Admitting: Family Medicine

## 2022-08-07 DIAGNOSIS — D573 Sickle-cell trait: Secondary | ICD-10-CM | POA: Diagnosis present

## 2022-08-07 DIAGNOSIS — O99214 Obesity complicating childbirth: Secondary | ICD-10-CM | POA: Diagnosis present

## 2022-08-07 DIAGNOSIS — O99824 Streptococcus B carrier state complicating childbirth: Secondary | ICD-10-CM | POA: Diagnosis present

## 2022-08-07 DIAGNOSIS — O480 Post-term pregnancy: Principal | ICD-10-CM | POA: Diagnosis present

## 2022-08-07 DIAGNOSIS — O9902 Anemia complicating childbirth: Secondary | ICD-10-CM | POA: Diagnosis present

## 2022-08-07 DIAGNOSIS — Z3A40 40 weeks gestation of pregnancy: Secondary | ICD-10-CM | POA: Diagnosis not present

## 2022-08-07 DIAGNOSIS — Z7982 Long term (current) use of aspirin: Secondary | ICD-10-CM

## 2022-08-07 DIAGNOSIS — O99820 Streptococcus B carrier state complicating pregnancy: Secondary | ICD-10-CM | POA: Diagnosis not present

## 2022-08-07 LAB — CBC
HCT: 33.8 % — ABNORMAL LOW (ref 36.0–46.0)
Hemoglobin: 11.5 g/dL — ABNORMAL LOW (ref 12.0–15.0)
MCH: 25.5 pg — ABNORMAL LOW (ref 26.0–34.0)
MCHC: 34 g/dL (ref 30.0–36.0)
MCV: 74.9 fL — ABNORMAL LOW (ref 80.0–100.0)
Platelets: 307 10*3/uL (ref 150–400)
RBC: 4.51 MIL/uL (ref 3.87–5.11)
RDW: 14.7 % (ref 11.5–15.5)
WBC: 12.4 10*3/uL — ABNORMAL HIGH (ref 4.0–10.5)
nRBC: 0 % (ref 0.0–0.2)

## 2022-08-07 LAB — TYPE AND SCREEN
ABO/RH(D): O POS
Antibody Screen: NEGATIVE

## 2022-08-07 MED ORDER — MISOPROSTOL 25 MCG QUARTER TABLET
25.0000 ug | ORAL_TABLET | Freq: Once | ORAL | Status: AC
  Administered 2022-08-08: 25 ug via VAGINAL
  Filled 2022-08-07: qty 1

## 2022-08-07 MED ORDER — ONDANSETRON HCL 4 MG/2ML IJ SOLN
4.0000 mg | Freq: Four times a day (QID) | INTRAMUSCULAR | Status: DC | PRN
  Administered 2022-08-08: 4 mg via INTRAVENOUS
  Filled 2022-08-07: qty 2

## 2022-08-07 MED ORDER — PENICILLIN G POT IN DEXTROSE 60000 UNIT/ML IV SOLN
3.0000 10*6.[IU] | INTRAVENOUS | Status: DC
  Administered 2022-08-08 (×5): 3 10*6.[IU] via INTRAVENOUS
  Filled 2022-08-07 (×5): qty 50

## 2022-08-07 MED ORDER — CLINDAMYCIN PHOSPHATE 900 MG/50ML IV SOLN: 900.0000 mg | Freq: Three times a day (TID) | INTRAVENOUS | Status: DC

## 2022-08-07 MED ORDER — ACETAMINOPHEN 325 MG PO TABS: 650.0000 mg | ORAL_TABLET | ORAL | Status: DC | PRN

## 2022-08-07 MED ORDER — SODIUM CHLORIDE 0.9 % IV SOLN
5.0000 10*6.[IU] | Freq: Once | INTRAVENOUS | Status: AC
  Administered 2022-08-07: 5 10*6.[IU] via INTRAVENOUS
  Filled 2022-08-07: qty 5

## 2022-08-07 MED ORDER — LIDOCAINE HCL (PF) 1 % IJ SOLN
30.0000 mL | INTRAMUSCULAR | Status: AC | PRN
  Administered 2022-08-09: 30 mL via SUBCUTANEOUS
  Filled 2022-08-07: qty 30

## 2022-08-07 MED ORDER — LACTATED RINGERS IV SOLN: INTRAVENOUS | Status: DC

## 2022-08-07 MED ORDER — TERBUTALINE SULFATE 1 MG/ML IJ SOLN: 0.2500 mg | Freq: Once | INTRAMUSCULAR | Status: DC | PRN

## 2022-08-07 MED ORDER — OXYTOCIN-SODIUM CHLORIDE 30-0.9 UT/500ML-% IV SOLN
2.5000 [IU]/h | INTRAVENOUS | Status: DC
  Filled 2022-08-07: qty 500

## 2022-08-07 MED ORDER — OXYTOCIN BOLUS FROM INFUSION
333.0000 mL | Freq: Once | INTRAVENOUS | Status: AC
  Administered 2022-08-09: 333 mL via INTRAVENOUS

## 2022-08-07 MED ORDER — HYDROXYZINE HCL 50 MG PO TABS: 50.0000 mg | ORAL_TABLET | Freq: Four times a day (QID) | ORAL | Status: DC | PRN

## 2022-08-07 MED ORDER — LACTATED RINGERS IV SOLN: 500.0000 mL | INTRAVENOUS | Status: DC | PRN

## 2022-08-07 MED ORDER — MISOPROSTOL 50MCG HALF TABLET
50.0000 ug | ORAL_TABLET | Freq: Once | ORAL | Status: AC
  Filled 2022-08-07: qty 1

## 2022-08-07 MED ORDER — FENTANYL CITRATE (PF) 100 MCG/2ML IJ SOLN
100.0000 ug | INTRAMUSCULAR | Status: DC | PRN
  Administered 2022-08-08 – 2022-08-09 (×2): 100 ug via INTRAVENOUS
  Filled 2022-08-07 (×2): qty 2

## 2022-08-07 MED ORDER — MISOPROSTOL 50MCG HALF TABLET
50.0000 ug | ORAL_TABLET | ORAL | Status: DC | PRN
  Administered 2022-08-08 (×3): 50 ug via ORAL
  Filled 2022-08-07 (×2): qty 1

## 2022-08-07 NOTE — H&P (Incomplete)
OBSTETRIC ADMISSION HISTORY AND PHYSICAL  Sydney Hart is a 21 y.o. female G1P0 with IUP at 2w4dby 173w4d[place] for IOL. She reports +FMs, No LOF, no VB, no blurry vision, headaches or peripheral edema, and RUQ pain.  She plans on breast feeding. She request IUD for birth control. She received her prenatal care at  [facility]    Dating: By 1664w4d [place]->  Estimated Date of Delivery: 08/03/22  Sono:    _0 , CWD, normal anatomy, cephalic presentation, [id number]%ile EFW   Prenatal History/Complications: BMI > 40.   Past Medical History: Past Medical History:  Diagnosis Date   Herniated lumbar intervertebral disc     Past Surgical History: Past Surgical History:  Procedure Laterality Date   NO PAST SURGERIES      Obstetrical History: OB History     Gravida  1   Para      Term      Preterm      AB      Living  0      SAB      IAB      Ectopic      Multiple      Live Births  0           Social History Social History   Socioeconomic History   Marital status: Single    Spouse name: Not on file   Number of children: Not on file   Years of education: Not on file   Highest education level: Not on file  Occupational History   Not on file  Tobacco Use   Smoking status: Never   Smokeless tobacco: Never  Vaping Use   Vaping Use: Former   Substances: Nicotine, Flavoring  Substance and Sexual Activity   Alcohol use: Not Currently   Drug use: Not Currently    Types: Marijuana    Comment: stopped June 2022   Sexual activity: Yes    Partners: Male    Birth control/protection: None  Other Topics Concern   Not on file  Social History Narrative   Lives at home with mom and brother. She is in 10th grade [employer] enjoys singing, drawing, and dancing   Social Determinants of Health   Financial Resource Strain: Not on file  Food Insecurity: Not on file  Transportation Needs: Not on file  Physical Activity: Not on file   Stress: Not on file  Social Connections: Not on file    Family History: Family History  Problem Relation Age of Onset   Asthma Mother    Bronchitis Mother    Migraines Maternal Aunt    Breast cancer Maternal Grandmother    Asthma Maternal Grandfather    Bronchitis Maternal Grandfather    Seizures Neg Hx    Autism Neg Hx    ADD / ADHD Neg Hx    Anxiety disorder Neg Hx    Depression Neg Hx    Bipolar disorder Neg Hx    Schizophrenia Neg Hx    Diabetes Neg Hx    Heart disease Neg Hx    Hypertension Neg Hx     Allergies: Allergies  Allergen Reactions   Other Itching    Peaches, pears, peas    Medications Prior to Admission  Medication Sig Dispense Refill Last Dose   aspirin EC 81 MG tablet Take 1 tablet (81 mg total) by mouth daily. Swallow whole. 30 tablet 11 Past Month   Prenatal Vit-Fe Phos-FA-Omega (VITAFOL GUMMIES) 3.33-0.333-34.8  MG CHEW Chew 3 tablets by mouth daily. 90 tablet 11 Past Week   Blood Pressure Monitoring (BLOOD PRESSURE KIT) DEVI 1 kit by Does not apply route once a week. 1 each 0    Magnesium Oxide -Mg Supplement (MAG-OXIDE) 200 MG TABS Take 2 tablets (400 mg total) by mouth at bedtime. If that amount causes loose stools in the am, switch to 240m daily at bedtime. (Patient not taking: Reported on 07/27/2022) 60 tablet 3      Review of Systems   All systems reviewed and negative except as stated in HPI  Blood pressure 123/64, pulse 93, temperature 98.5 F (36.9 C), temperature source Oral, resp. rate 20, height 5' 3.5" (1.613 m), weight 110.9 kg, last menstrual period 11/25/2021, SpO2 99 %. General appearance: alert, cooperative, and appears stated age Lungs: clear to auscultation bilaterally Heart: regular rate and rhythm Abdomen: soft, non-tender; bowel sounds normal Pelvic: no abnormalities Extremities: Homans sign is negative, no sign of DVT DTR's intact Presentation: cephalic Fetal monitoringBaseline: 135 bpm, Variability: Good {> 6 bpm),  Accelerations: Reactive, and Decelerations: Absent Uterine activityFrequency: <5 times per hour     Prenatal labs: ABO, Rh: --/--/PENDING (08/28 2152) Antibody: PENDING (08/28 2152) Rubella: 3.53 (03/22 1008) RPR: Non Reactive (05/31 1118)  HBsAg: Negative (03/22 1008)  HIV: Non Reactive (05/31 1118)  GBS: Positive/-- (08/02 1610)  1 hr Glucola 80 Genetic screening  NIPS: LR, sickle cell carrier, AFP: negative Anatomy [place]  Prenatal Transfer Tool  Maternal Diabetes: No Genetic Screening: Abnormal:  Results: Other:sickle cell carrier Maternal Ultrasounds/Referrals: Normal Fetal Ultrasounds or other Referrals:  Referred to Materal Fetal Medicine  Maternal Substance Abuse:  No Significant Maternal Medications:  None Significant Maternal Lab Results: Group B Strep positive  Results for orders placed or performed during the hospital encounter of 08/07/22 (from the past 24 hour(s))  Type and screen   Collection Time: 08/07/22  9:52 PM  Result Value Ref Range   ABO/RH(D) PENDING    Antibody Screen PENDING    Sample Expiration      08/10/2022,2359 [facility]formed [facility] [address] [place] [address]  CBC   Collection Time: 08/07/22  9:54 PM  Result Value Ref Range   WBC 12.4 (H) 4.0 - 10.5 K/uL   RBC 4.51 3.87 - 5.11 MIL/uL   Hemoglobin 11.5 (L) 12.0 - 15.0 g/dL   HCT 33.8 (L) 36.0 - 46.0 %   MCV 74.9 (L) 80.0 - 100.0 fL   MCH 25.5 (L) 26.0 - 34.0 pg   MCHC 34.0 30.0 - 36.0 g/dL   RDW 14.7 11.5 - 15.5 %   Platelets 307 150 - 400 K/uL   nRBC 0.0 0.0 - 0.2 %    Patient Active Problem List   Diagnosis Date Noted   Post term pregnancy over 40 weeks 08/07/2022   GBS (group B Streptococcus carrier), +RV culture, currently pregnant 07/12/2022   Obesity affecting pregnancy in third trimester 05/15/2022   Sickle-cell trait ([place] 03/16/2022   Obesity (BMI 30.0-34.9) 03/01/2022   Supervision of normal first teen pregnancy 02/20/2022   Migraine  without aura and without status migrainosus, not intractable 08/08/2018   Tension headache 08/08/2018    Assessment/Plan:  ATheodore Rahrigis a 21y.o. G1P0 at 474w4dere for IOL for term delivery.    #Labor:Discussed options for augmentation including foley balloon and cytotec. Cytotec placed. #Pain: IV pain medication but will want epidural #FWB: Cat I  #ID:  GBS (+). PCN started.  #MOF: Breast #MOC: Declines #Circ:  Yes  Apolonio Schneiders, MD  Resident Physician  08/07/2022, 10:44 PM   GME ATTESTATION:  I saw and evaluated the patient. I agree with the findings and the plan of care as documented in the resident's note. I have made changes to documentation as necessary.  Gerlene Fee, DO OB Fellow, [place] for [place] 08/08/2022, 5:35 AM

## 2022-08-08 LAB — RPR: RPR Ser Ql: NONREACTIVE

## 2022-08-08 MED ORDER — MISOPROSTOL 50MCG HALF TABLET
50.0000 ug | ORAL_TABLET | ORAL | Status: DC | PRN
  Administered 2022-08-08: 50 ug via BUCCAL
  Filled 2022-08-08: qty 1

## 2022-08-08 MED ORDER — PENICILLIN G POTASSIUM 5000000 UNITS IJ SOLR
5.0000 10*6.[IU] | Freq: Once | INTRAMUSCULAR | Status: AC
  Administered 2022-08-08: 5 10*6.[IU] via INTRAVENOUS
  Filled 2022-08-08: qty 5

## 2022-08-08 MED ORDER — OXYTOCIN-SODIUM CHLORIDE 30-0.9 UT/500ML-% IV SOLN
1.0000 m[IU]/min | INTRAVENOUS | Status: DC
  Administered 2022-08-08: 2 m[IU]/min via INTRAVENOUS

## 2022-08-08 MED ORDER — CYCLOBENZAPRINE HCL 5 MG PO TABS
10.0000 mg | ORAL_TABLET | Freq: Once | ORAL | Status: AC
Start: 2022-08-08 — End: 2022-08-08
  Administered 2022-08-08: 10 mg via ORAL
  Filled 2022-08-08: qty 2

## 2022-08-08 NOTE — Progress Notes (Addendum)
Labor Progress Note Sydney Hart is a 21 y.o. G1P0 at [redacted]w[redacted]d presented for IOL due to PD. S: Patient is resting comfortably. Feels some contractions.  O:  BP (!) 101/53   Pulse 85   Temp 98.4 F (36.9 C) (Oral)   Resp 16   Ht 5' 3.5" (1.613 m)   Wt 110.9 kg   LMP 11/25/2021   SpO2 100%   BMI 42.63 kg/m  EFM: 130/moderate variability/accels present  CVE: Dilation: Closed Effacement (%): Thick Station: -3 Presentation: Vertex Exam by:: Chriss Driver, RN   A&P: 21 y.o. G1P0 [redacted]w[redacted]d here for IOL due to PD. #Labor: Progressing well. Foley bulb inserted. Additional cytotec oral administered, recheck when FB is out #Pain: Considering waterbirth versus epidural #FWB: Cat 1 #GBS positive, s/p PCN   Celine Mans, MD, PGY-1 Center for [facility], [facility] 9:51 AM _______________________  GME ATTESTATION:  Evaluation and management procedures were performed by the [facility] Medicine Resident under my supervision. I was immediately available for direct supervision, assistance and direction throughout this encounter.  I also confirm that I have verified the information documented in the resident's note, and that I have also personally reperformed the pertinent components of the physical exam and all of the medical decision making activities.  I have also made any necessary editorial changes.  Myrtie Hawk, DO OB Fellow, Faculty [facility], Center for [facility] 08/08/2022 10:09 AM

## 2022-08-08 NOTE — Progress Notes (Signed)
Labor Progress Note Sydney Hart is a 21 y.o. G1P0 at [redacted]w[redacted]d presented for IOL for postdates  S:  Resting on ball. Reports back pain from herniations and some nausea. FB still in place  O:  BP 117/85   Pulse 84   Temp 98.4 F (36.9 C) (Oral)   Resp 16   Ht 5' 3.5" (1.613 m)   Wt 110.9 kg   LMP 11/25/2021   SpO2 100%   BMI 42.63 kg/m   Fetal Tracing:  Baseline: 135 Variability: moderate Accels: 15x15  Decels: none  Toco: occasional uc's   CVE: Dilation: 1 Effacement (%): Thick Station: -3 Presentation: Vertex Exam by:: Dr. Lanae Crumbly   A&P: 21 y.o. G1P0 [redacted]w[redacted]d IOL for postdates #Labor: FB in place. Continue cytotec. Offered flexeril for back pain and patient agreeable. Zofran from nausea PRN #Pain: per patient request #FWB: Cat 1 #GBS positive  Rolm Bookbinder, CNM 1:55 PM

## 2022-08-08 NOTE — Progress Notes (Signed)
Patient ID: Sydney Hart, female   DOB: Oct 19, 2001, 21 y.o.   MRN: [id number] Sydney Hart is a 21 y.o. G1P0 at [redacted]w[redacted]d admitted for induction of labor due to elective and postdates  Subjective: uncomfortable w/ contractions and requesting pain meds  Objective: BP 134/68   Pulse 89   Temp 98.4 F (36.9 C) (Oral)   Resp 18   Ht 5' 3.5" (1.613 m)   Wt 110.9 kg   LMP 11/25/2021   SpO2 100%   BMI 42.63 kg/m  No intake/output data recorded.  FHR baseline 140 bpm, Variability: moderate, Accelerations:present, Decelerations:  Absent Toco: q 2 mins   SVE:   Dilation: 5 Effacement (%): 60 Station: -1 Exam by:: Cleone Slim CNM  Pitocin @ 6 mu/min  Labs: Lab Results  Component Value Date   WBC 12.4 (H) 08/07/2022   HGB 11.5 (L) 08/07/2022   HCT 33.8 (L) 08/07/2022   MCV 74.9 (L) 08/07/2022   PLT 307 08/07/2022    Assessment / Plan: IOL d/t elective/term, s/p cytotec x 6, foley bulb out~1800, pit started @ 0815, currently at 78mu/min, starting to feel uncomfortable, requesting IV pain meds. Still planning waterbirth if able to labor w/o pit  Labor: s/p cervical ripening Fetal Wellbeing:  Category I Pain Control:  requesting IV pain meds Pre-eclampsia: N/A I/D:  PCN for GBS+ Anticipated MOD: NSVB  Cheral Marker CNM, WHNP-BC 08/08/2022, 8:38 PM

## 2022-08-08 NOTE — Progress Notes (Signed)
Labor Progress Note Sydney Hart is a 21 y.o. G1P0 at [redacted]w[redacted]d presented for postdates IOL  S:  Patient more comfortable since FB out  O:  BP 117/72   Pulse 91   Temp 98.4 F (36.9 C) (Oral)   Resp 16   Ht 5' 3.5" (1.613 m)   Wt 110.9 kg   LMP 11/25/2021   SpO2 100%   BMI 42.63 kg/m   Fetal Tracing:  Baseline: 130 Variability: moderate Accels: 15x15 Decels: none  Toco: none   CVE: Dilation: 5 Effacement (%): 60 Station: -1 Presentation: Vertex Exam by:: Cleone Slim CNM   A&P: 21 y.o. G1P0 [redacted]w[redacted]d postdates IOL #Labor: Progressing well. Options for further IOL discussed. Reviewed that we could try more cytotec to see if she labors off of the cytotec but is unlikely. Discussed starting pitocin and AROM with regular contractionsbut cautioned there is possibility that she will not be able to have waterbirth if we cannot keep her in labor without pitocin. Risks and benefits of both reviewed at length and patient desires pitocin. Will start pit 2x2 #Pain: per patient request #FWB: Cat 1 #GBS positive   Rolm Bookbinder, CNM 6:14 PM

## 2022-08-08 NOTE — Progress Notes (Signed)
Labor Progress Note Nazarene Bunning is a 21 y.o. G1P0 at [redacted]w[redacted]d presented for IOL 2/2 PD.   S: Doing well resting.   O:  BP (!) 99/57   Pulse 84   Temp 98.6 F (37 C) (Oral)   Resp 20   Ht 5' 3.5" (1.613 m)   Wt 110.9 kg   LMP 11/25/2021   SpO2 100%   BMI 42.63 kg/m  EFM: 125BPM/moderate/+accels, no decels  CVE: Dilation: Closed Effacement (%): Thick Station: -3 Presentation: Vertex Exam by:: Cashion,Student Resident   A&P: 21 y.o. G1P0 [redacted]w[redacted]d IOL 2/2 PD.  #Labor: Cyto x1. FB attempted.  #Pain: maternally supported.  #FWB: Cat I  #GBS positive  Desires waterbirth.   Peng Thorstenson Autry-Lott, DO 5:36 AM

## 2022-08-08 NOTE — Progress Notes (Addendum)
Labor Progress Note  Sydney Hart is a 21 y.o. G1P0 at [redacted]w[redacted]d presented for IOL 2/2 PD.    S: Doing well resting. Starting to feel contractions. Pain well controlled.    O:  BP (!) 99/57   Pulse 84   Temp 98.6 F (37 C) (Oral)   Resp 20   Ht 5' 3.5" (1.613 m)   Wt 110.9 kg   LMP 11/25/2021   SpO2 100%   BMI 42.63 kg/m  EFM: 125BPM/moderate/+accels, no decels   CVE: Dilation: Closed Effacement (%): Thick Station: -3 Presentation: Vertex Exam by:: Cashion, Colter, MD     A&P: 21 y.o. G1P0 [redacted]w[redacted]d IOL 2/2 PD.  #Labor: Cyto x2 doses both orally. Can reattempt FB next check.  #Pain: maternally supported.  #FWB: Cat I  #Contractions: Run of tachysystole after 2nd dose.  #GBS positive   Desires waterbirth.   Moody Bruins Resident Physician 08/08/2022  GME ATTESTATION:  I saw and evaluated the patient. I agree with the findings and the plan of care as documented in the resident's note. I have made changes to documentation as necessary.  Lavonda Jumbo, DO OB Fellow, Faculty [facility], Center for [facility] 08/08/2022, 7:09 AM

## 2022-08-08 NOTE — Progress Notes (Signed)
Patient ID: Sydney Hart, female   DOB: [birth year], 21 y.o.   MRN: [id number] Sydney Hart is a 21 y.o. G1P0 at [redacted]w[redacted]d admitted for induction of labor due to elective and postdates  Subjective: uncomfortable w/ contractions and requesting nitrous oxide  Objective: BP 134/68   Pulse 89   Temp 98.4 F (36.9 C) (Oral)   Resp 18   Ht 5' 3.5" (1.613 m)   Wt 110.9 kg   LMP 11/25/2021   SpO2 100%   BMI 42.63 kg/m  No intake/output data recorded.  FHR baseline 135 bpm, Variability: moderate, Accelerations:present, Decelerations:  Present  earlies, some mild variables, not tracing continuously at all times d/t maternal position (leaning on bed swaying) Toco:  approximately q 1-52mins    SVE:   Dilation: 5 Effacement (%): 70 Station: -1 Exam by:: Chriss Driver, RN  SROM clear fluid 2140  Pitocin @ 8 mu/min  Labs: Lab Results  Component Value Date   WBC 12.4 (H) 08/07/2022   HGB 11.5 (L) 08/07/2022   HCT 33.8 (L) 08/07/2022   MCV 74.9 (L) 08/07/2022   PLT 307 08/07/2022    Assessment / Plan: IOL d/t elective/postdates, s/p cytotec x 6, foley bulb (out @ ~1800), pit @ 96mu/min, SROM'd clear fluid 2140, cx essentially unchanged- a little thinner, uncomfortable w/ contractions, requesting nitrous . Still planning waterbirth if able. Once starts changing cervix, can trial coming off pit to see if she continues to labor on her own  Labor: early Fetal Wellbeing:  Category II Pain Control:  nitrous oxide Pre-eclampsia: N/A I/D:  PCN for GBS+ Anticipated MOD: NSVB  Cheral Marker CNM, WHNP-BC 08/08/2022, 10:38 PM

## 2022-08-09 ENCOUNTER — Encounter (HOSPITAL_COMMUNITY): Payer: Self-pay | Admitting: Family Medicine

## 2022-08-09 ENCOUNTER — Encounter: Payer: Medicaid Other | Admitting: Obstetrics & Gynecology

## 2022-08-09 DIAGNOSIS — O480 Post-term pregnancy: Secondary | ICD-10-CM

## 2022-08-09 DIAGNOSIS — O9902 Anemia complicating childbirth: Secondary | ICD-10-CM

## 2022-08-09 DIAGNOSIS — O99820 Streptococcus B carrier state complicating pregnancy: Secondary | ICD-10-CM

## 2022-08-09 DIAGNOSIS — D573 Sickle-cell trait: Secondary | ICD-10-CM

## 2022-08-09 DIAGNOSIS — Z3A40 40 weeks gestation of pregnancy: Secondary | ICD-10-CM

## 2022-08-09 MED ORDER — ACETAMINOPHEN 325 MG PO TABS
650.0000 mg | ORAL_TABLET | ORAL | Status: DC | PRN
  Administered 2022-08-10 – 2022-08-11 (×2): 650 mg via ORAL
  Filled 2022-08-09 (×2): qty 2

## 2022-08-09 MED ORDER — DIPHENHYDRAMINE HCL 25 MG PO CAPS: 25.0000 mg | ORAL_CAPSULE | Freq: Four times a day (QID) | ORAL | Status: DC | PRN

## 2022-08-09 MED ORDER — SIMETHICONE 80 MG PO CHEW: 80.0000 mg | CHEWABLE_TABLET | ORAL | Status: DC | PRN

## 2022-08-09 MED ORDER — DIBUCAINE (PERIANAL) 1 % EX OINT: 1.0000 | TOPICAL_OINTMENT | CUTANEOUS | Status: DC | PRN

## 2022-08-09 MED ORDER — ZOLPIDEM TARTRATE 5 MG PO TABS: 5.0000 mg | ORAL_TABLET | Freq: Every evening | ORAL | Status: DC | PRN

## 2022-08-09 MED ORDER — TETANUS-DIPHTH-ACELL PERTUSSIS 5-2.5-18.5 LF-MCG/0.5 IM SUSY: 0.5000 mL | PREFILLED_SYRINGE | Freq: Once | INTRAMUSCULAR | Status: DC

## 2022-08-09 MED ORDER — SENNOSIDES-DOCUSATE SODIUM 8.6-50 MG PO TABS
2.0000 | ORAL_TABLET | Freq: Every day | ORAL | Status: DC
  Administered 2022-08-10 – 2022-08-11 (×2): 2 via ORAL
  Filled 2022-08-09 (×2): qty 2

## 2022-08-09 MED ORDER — COCONUT OIL OIL: 1.0000 | TOPICAL_OIL | Status: DC | PRN

## 2022-08-09 MED ORDER — WITCH HAZEL-GLYCERIN EX PADS: 1.0000 | MEDICATED_PAD | CUTANEOUS | Status: DC | PRN

## 2022-08-09 MED ORDER — PRENATAL MULTIVITAMIN CH
1.0000 | ORAL_TABLET | Freq: Every day | ORAL | Status: DC
  Administered 2022-08-09 – 2022-08-11 (×3): 1 via ORAL
  Filled 2022-08-09 (×3): qty 1

## 2022-08-09 MED ORDER — ONDANSETRON HCL 4 MG PO TABS: 4.0000 mg | ORAL_TABLET | ORAL | Status: DC | PRN

## 2022-08-09 MED ORDER — IBUPROFEN 600 MG PO TABS
600.0000 mg | ORAL_TABLET | Freq: Four times a day (QID) | ORAL | Status: DC
  Administered 2022-08-09 – 2022-08-11 (×11): 600 mg via ORAL
  Filled 2022-08-09 (×11): qty 1

## 2022-08-09 MED ORDER — ONDANSETRON HCL 4 MG/2ML IJ SOLN: 4.0000 mg | INTRAMUSCULAR | Status: DC | PRN

## 2022-08-09 MED ORDER — BENZOCAINE-MENTHOL 20-0.5 % EX AERO
1.0000 | INHALATION_SPRAY | CUTANEOUS | Status: DC | PRN
  Administered 2022-08-09: 1 via TOPICAL
  Filled 2022-08-09: qty 56

## 2022-08-09 NOTE — Lactation Note (Signed)
This note was copied from a baby's chart. Lactation Consultation Note  Patient Name: Sydney Hart [id number]'P Date: 08/09/2022 Reason for consult: Initial assessment;1st time breastfeeding;Primapara;Term (Birth parent sitting in the chair with baby STS on moms chest. per Birth parent baby last fed at 7:15 am for 10 mins with swallows. LC reviewed feeding cues and encouraged mom to call for Latch assessment. per Birth parent - wet right after delivery.) Age:20 hours  Maternal Data    Feeding Mother's Current Feeding Choice: Breast Milk  LATCH Score - Birh paremt aware to call for Latch assessment    Lactation Tools Discussed/Used    Interventions Interventions: Breast feeding basics reviewed;Education;LC Services brochure  Discharge WIC Program:  (per mom has Medicaid insurance - and will check the type to see if she qualifies for [employer])  Consult Status Consult Status: Follow-up Date: 08/09/22 Follow-up type: In-patient    [name] [name] 08/09/2022, 8:31 AM

## 2022-08-09 NOTE — Lactation Note (Signed)
This note was copied from a baby's chart. Lactation Consultation Note  Patient Name: Sydney Hart [id number] Date: 08/09/2022   Age:20 hours LC attempted to see mom but mom is to tired. Would like Lactation to come back today and see her after she has rested some. RN stated she did see a latch.  Maternal Data    Feeding    LATCH Score Latch: Repeated attempts needed to sustain latch, nipple held in mouth throughout feeding, stimulation needed to elicit sucking reflex.  Audible Swallowing: A few with stimulation  Type of Nipple: Everted at rest and after stimulation  Comfort (Breast/Nipple): Soft / non-tender  Hold (Positioning): Assistance needed to correctly position infant at breast and maintain latch.  LATCH Score: 7   Lactation Tools Discussed/Used    Interventions Interventions: Breast feeding basics reviewed;Assisted with latch;Skin to skin;Breast massage;Adjust position;Support pillows;Position options;Education  Discharge    Consult Status      Charyl Dancer 08/09/2022, 4:55 AM

## 2022-08-09 NOTE — Discharge Summary (Addendum)
Postpartum Discharge Summary  Date of Service 08/11/2022     Patient Name: Sydney Hart [facility] DOB: May 21, 2001 MRN: [id number]  Date of admission: 08/07/2022 Delivery date:08/09/2022  Delivering provider: Wells Guiles R  Date of discharge: 08/11/2022  Admitting diagnosis: Post term pregnancy over 40 weeks [O48.0] Intrauterine pregnancy: [redacted]w[redacted]d    Secondary diagnosis:  Principal Problem:   Post term pregnancy over 40 weeks  Additional problems: none    Discharge diagnosis: Term Pregnancy Delivered                                              Post partum procedures: None Augmentation: Pitocin, Cytotec, and IP Foley Complications: None  Hospital course: Induction of Labor With Vaginal Delivery   21y.o. yo G1P0 at 432w6das admitted to the hospital 08/07/2022 for induction of labor.  Indication for induction: Postdates and Elective.  Patient had an uncomplicated labor course as follows: Membrane Rupture Time/Date: 9:40 PM ,08/08/2022   Delivery Method:Vaginal, Spontaneous  Episiotomy: None None Lacerations:  2nd degree;Labial 2nd degree and right labial Details of delivery can be found in separate delivery note.  Patient had a routine postpartum course. Patient is discharged home 08/11/2022.  Newborn Data: Birth date:08/09/2022  Birth time:1:37 AM  Gender:Female  Living status:Living  Apgars:8 ,9  Weight:3230 g 3230g  Magnesium Sulfate received: No BMZ received: No Rhophylac:N/A MMR:N/A T-DaP: Declined 05-15-22 Flu: No Transfusion:No  Physical exam  Vitals:   08/10/22 0630 08/10/22 1625 08/10/22 2006 08/11/22 0541  BP: (!) 107/56 (!) 104/56 113/65 114/64  Pulse: 88 (!) 102 87 99  Resp: 17 18 18 16   Temp: 97.9 F (36.6 C) 98.5 F (36.9 C) 98.3 F (36.8 C) 98.4 F (36.9 C)  TempSrc: Oral Oral Oral Oral  SpO2: 100%  100% 100%  Weight:      Height:       General: alert, cooperative, and no distress Lochia: appropriate Uterine Fundus: firm Incision: N/A DVT  Evaluation: No evidence of DVT seen on physical exam. No cords or calf tenderness. Labs: Lab Results  Component Value Date   WBC 12.4 (H) 08/07/2022   HGB 11.5 (L) 08/07/2022   HCT 33.8 (L) 08/07/2022   MCV 74.9 (L) 08/07/2022   PLT 307 08/07/2022      Latest Ref Rng & Units 03/17/2022    3:21 PM  CMP  Glucose 70 - 99 mg/dL 100   BUN 6 - 20 mg/dL 6   Creatinine 0.44 - 1.00 mg/dL 0.58   Sodium 135 - 145 mmol/L 128   Potassium 3.5 - 5.1 mmol/L 3.5   Chloride 98 - 111 mmol/L 99   CO2 22 - 32 mmol/L 20   Calcium 8.9 - 10.3 mg/dL 9.0    Edinburgh Score:    08/10/2022   12:16 PM  Edinburgh Postnatal Depression Scale Screening Tool  I have been able to laugh and see the funny side of things. 0  I have looked forward with enjoyment to things. 0  I have blamed myself unnecessarily when things went wrong. 1  I have been anxious or worried for no good reason. 2  I have felt scared or panicky for no good reason. 1  Things have been getting on top of me. 1  I have been so unhappy that I have had difficulty sleeping. 0  I have felt sad or miserable.  0  I have been so unhappy that I have been crying. 0  The thought of harming myself has occurred to me. 0  Edinburgh Postnatal Depression Scale Total 5     After visit meds:  Allergies as of 08/11/2022       Reactions   Other Itching   Peaches, pears, peas        Medication List     STOP taking these medications    aspirin EC 81 MG tablet       TAKE these medications    acetaminophen 325 MG tablet Commonly known as: Tylenol Take 2 tablets (650 mg total) by mouth every 4 (four) hours as needed (for pain scale < 4).   Blood Pressure Kit Devi 1 kit by Does not apply route once a week.   coconut oil Oil Apply 1 Application topically as needed.   ibuprofen 600 MG tablet Commonly known as: ADVIL Take 1 tablet (600 mg total) by mouth every 6 (six) hours.   Magnesium Oxide -Mg Supplement 200 MG Tabs Commonly known as:  Mag-Oxide Take 2 tablets (400 mg total) by mouth at bedtime. If that amount causes loose stools in the am, switch to 260m daily at bedtime.   Vitafol Gummies 3.33-0.333-34.8 MG Chew Chew 3 tablets by mouth daily.         Discharge home in stable condition Infant Feeding: Breast Infant Disposition:home with mother Discharge instruction: per After Visit Summary and Postpartum booklet. Activity: Advance as tolerated. Pelvic rest for 6 weeks.  Diet: routine diet Future Appointments: Future Appointments  Date Time Provider DMarion 09/26/2022 10:55 AM FJohnston Ebbs NP CRepublicNone   Follow up Visit:  Follow-up [place] Schedule an appointment as soon as possible for a visit in 2 week(s).   Specialty: Obstetrics and Gynecology Contact information: [address] [place]              BRoma Schanz CNM  P [facility] Admin [facility] Please schedule this patient for PP visit in: 4-6  Low risk pregnancy complicated by: none  Delivery mode:  SVD  Anticipated Birth Control:  abstinence  PP Procedures needed: none  Schedule Integrated BH visit: no  Provider: Any provider   08/11/2022 AJanice Norrie MD PGY3  ______________ GME ATTESTATION:  Evaluation and management procedures were performed by the [facility] under my supervision. I was immediately available for direct supervision, assistance and direction throughout this encounter.  I also confirm that I have verified the information documented in the resident's note, and that I have also personally reperformed the pertinent components of the physical exam and all of the medical decision making activities.  I have also made any necessary editorial changes.  JShelda Pal DO OB Fellow, [place]9/12/2021 9:33 AM

## 2022-08-10 NOTE — Progress Notes (Signed)
POSTPARTUM PROGRESS NOTE  Post Partum Day 1  Subjective:  Sydney Hart is a 21 y.o. G1P1001 s/p SVD at [redacted]w[redacted]d.  She reports she is doing well. No acute events overnight. She denies any problems with ambulating, voiding or po intake. Denies nausea or vomiting.  Pain is well controlled.  Lochia is appropriate.  Objective: Blood pressure (!) 107/56, pulse 88, temperature 97.9 F (36.6 C), temperature source Oral, resp. rate 17, height 5' 3.5" (1.613 m), weight 110.9 kg, last menstrual period 11/25/2021, SpO2 100 %, unknown if currently breastfeeding.  Physical Exam:  General: alert, cooperative and no distress Chest: no respiratory distress Heart:regular rate, distal pulses intact Abdomen: soft, nontender,  Uterine Fundus: firm, appropriately tender DVT Evaluation: No calf swelling or tenderness Extremities: no edema Skin: warm, dry  Recent Labs    08/07/22 2154  HGB 11.5*  HCT 33.8*    Assessment/Plan: Assurant is a 21 y.o. G1P1001 s/p SVD at [redacted]w[redacted]d   PPD#1 - Doing well  Routine postpartum care  Contraception: declines Feeding: breast Dispo: Plan for discharge tomorrow.   LOS: 3 days   Derrel Nip, MD  OB fellow  08/10/2022, 7:11 AM

## 2022-08-10 NOTE — Progress Notes (Signed)
Circumcision Consent  Discussed with mom at bedside about circumcision.   Circumcision is a surgery that removes the skin that covers the tip of the penis, called the "foreskin." Circumcision is usually done when a boy is between 12 and 71 days old, sometimes up to 2-84 weeks old.  The most common reasons boys are circumcised include for cultural/religious beliefs or for parental preference (potentially easier to clean, so baby looks like daddy, etc).  There may be some medical benefits for circumcision:   Circumcised boys seem to have slightly lower rates of: ? Urinary tract infections (per the American Academy of Pediatrics an uncircumcised boy has a 1/100 chance of developing a UTI in the first year of life, a circumcised boy at a 12/998 chance of developing a UTI in the first year of life- a 10% reduction) ? Penis cancer (typically rare- an uncircumcised female has a 1 in 100,000 chance of developing cancer of the penis) ? Sexually transmitted infection (in endemic areas, including HIV, HPV and Herpes- circumcision does NOT protect against gonorrhea, chlamydia, trachomatis, or syphilis) ? Phimosis: a condition where that makes retraction of the foreskin over the glans impossible (0.4 per 1000 boys per year or 0.6% of boys are affected by their 15th birthday)  Boys and men who are not circumcised can reduce these extra risks by: ? Cleaning their penis well ? Using condoms during sex  What are the risks of circumcision?  As with any surgical procedure, there are risks and complications. In circumcision, complications are rare and usually minor, the most common being: ? Bleeding- risk is reduced by holding each clamp for 30 seconds prior to a cut being made, and by holding pressure after the procedure is done ? Infection- the penis is cleaned prior to the procedure, and the procedure is done under sterile technique ? Damage to the urethra or amputation of the penis  How is circumcision done  in baby boys?  The baby will be placed on a special table and the legs restrained for their safety. Numbing medication is injected into the penis, and the skin is cleansed with betadine to decrease the risk of infection.   What to expect:  The penis will look red and raw for 5-7 days as it heals. We expect scabbing around where the cut was made, as well as clear-pink fluid and some swelling of the penis right after the procedure. If your baby's circumcision starts to bleed or develops pus, please contact your pediatrician immediately.  All questions were answered and mother consented.  Celedonio Savage, MD 7:12 AM

## 2022-08-11 ENCOUNTER — Other Ambulatory Visit (HOSPITAL_COMMUNITY): Payer: Self-pay

## 2022-08-11 MED ORDER — ACETAMINOPHEN 325 MG PO TABS: 650.0000 mg | ORAL_TABLET | ORAL | Status: AC | PRN

## 2022-08-11 MED ORDER — SENNOSIDES-DOCUSATE SODIUM 8.6-50 MG PO TABS
2.0000 | ORAL_TABLET | Freq: Every day | ORAL | 0 refills | Status: AC
  Filled 2022-08-11: qty 30, 15d supply, fill #0

## 2022-08-11 MED ORDER — COCONUT OIL OIL: 1.0000 | TOPICAL_OIL | 0 refills | Status: AC | PRN

## 2022-08-11 MED ORDER — BENZOCAINE-MENTHOL 20-0.5 % EX AERO
1.0000 | INHALATION_SPRAY | CUTANEOUS | 0 refills | Status: AC | PRN
  Filled 2022-08-11: qty 78, fill #0

## 2022-08-11 MED ORDER — IBUPROFEN 600 MG PO TABS: 600.0000 mg | ORAL_TABLET | Freq: Four times a day (QID) | ORAL | 0 refills | Status: AC

## 2022-08-11 NOTE — Lactation Note (Signed)
This note was copied from a baby's chart. Lactation Consultation Note  Patient Name: Sydney Hart [id number] Date: 08/11/2022 Reason for consult: Follow-up assessment;Primapara;1st time breastfeeding;Term Age:20 hours  LC in to room prior to discharge. Birthing parent reports good feedings and deny any pain/discomfort. Discussed normal behavior and patterns after 24h, voids and stools as signs good intake, pumping, clusterfeeding, skin to skin. Talked about milk coming into volume and managing engorgement.  Provided manual pump, talked about care and milk storage. Reviewed local resources available after discharge.   Plan: 1-Aim for a deep, comfortable latch, breastfeeding on demand or 8-12 times in 24h period. 2-Hand express/pump as needed for supplementation 3-Encouraged birthing parent rest, hydration and food intake.   Contact LC as needed for feeds/support/concerns/questions. All questions answered at this time. Reviewed LC brochure.     Maternal Data Has patient been taught Hand Expression?: Yes Does the patient have breastfeeding experience prior to this delivery?: No  Feeding Mother's Current Feeding Choice: Breast Milk  Lactation Tools Discussed/Used Tools: Pump;Flanges Flange Size: 27 Breast pump type: Manual Pump Education: Setup, frequency, and cleaning;Milk Storage Reason for Pumping: mother's request Pumping frequency: as needed Pumped volume: 30 mL  Interventions Interventions: Breast feeding basics reviewed;Skin to skin;Hand express;Breast massage;Hand pump;Expressed milk;Education;Pace feeding;LC Services brochure  Discharge Discharge Education: Engorgement and breast care;Warning signs for feeding baby Pump: Manual WIC Program: No (Plans to apply)  Consult Status Consult Status: Complete Date: 08/11/22    Ahlivia Salahuddin A Higuera Ancidey 08/11/2022, 12:06 PM

## 2022-08-17 ENCOUNTER — Telehealth (HOSPITAL_COMMUNITY): Payer: Self-pay | Admitting: *Deleted

## 2022-08-17 NOTE — Telephone Encounter (Signed)
Mom reports feeling good. No concerns about herself at this time. EPDS=2 [facility] score=5) Mom reports baby is doing well. Feeding, peeing, and pooping without difficulty. Safe sleep reviewed. Mom reports no concerns about baby at present.  Duffy Rhody, RN 08-17-2022 at 11:50am

## 2022-08-19 ENCOUNTER — Encounter: Payer: Self-pay | Admitting: Family Medicine

## 2022-09-26 ENCOUNTER — Ambulatory Visit (INDEPENDENT_AMBULATORY_CARE_PROVIDER_SITE_OTHER): Payer: Medicaid Other | Admitting: Student

## 2022-09-26 ENCOUNTER — Encounter: Payer: Self-pay | Admitting: Student

## 2022-09-26 DIAGNOSIS — Z91410 Personal history of adult physical and sexual abuse: Secondary | ICD-10-CM | POA: Diagnosis not present
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
## 2022-09-27 ENCOUNTER — Encounter: Payer: Self-pay | Admitting: Family Medicine

## 2022-09-27 ENCOUNTER — Ambulatory Visit (INDEPENDENT_AMBULATORY_CARE_PROVIDER_SITE_OTHER): Payer: Medicaid Other | Admitting: Family Medicine

## 2022-09-27 VITALS — BP 117/69 | HR 92 | Ht 63.5 in | Wt 229.0 lb

## 2022-09-27 DIAGNOSIS — M9903 Segmental and somatic dysfunction of lumbar region: Secondary | ICD-10-CM

## 2022-09-27 DIAGNOSIS — M9905 Segmental and somatic dysfunction of pelvic region: Secondary | ICD-10-CM

## 2022-09-27 DIAGNOSIS — M545 Low back pain, unspecified: Secondary | ICD-10-CM | POA: Diagnosis not present

## 2022-09-27 DIAGNOSIS — M9908 Segmental and somatic dysfunction of rib cage: Secondary | ICD-10-CM | POA: Diagnosis not present

## 2022-09-27 DIAGNOSIS — M9902 Segmental and somatic dysfunction of thoracic region: Secondary | ICD-10-CM | POA: Diagnosis not present

## 2022-09-27 DIAGNOSIS — M9904 Segmental and somatic dysfunction of sacral region: Secondary | ICD-10-CM

## 2022-09-27 NOTE — Progress Notes (Unsigned)
Patient is 7 weeks postpartum with some continued back pain.  Patient is breastfeeding.

## 2022-09-27 NOTE — Progress Notes (Signed)
   Subjective:    Patient ID: Sydney Hart, female    DOB: February 04, 2001, 20 y.o.   MRN: [id number]  HPI  Patient seen for follow-up of back pain.  She is about 6 to 7 weeks postpartum from a vaginal delivery.  She has noticed increasing back pain since her delivery.  Pain is located in her lower back without radiation.  She does feel some grinding with particular rotational movements, which she feels more in her SI joints.  She also has some mid back pain.  Review of Systems     Objective:   Physical Exam Vitals reviewed.  Constitutional:      Appearance: Normal appearance.  Musculoskeletal:     Comments: MSK: Restriction, tenderness, tissue texture changes, and paraspinal spasm in the lumbar and thoracic spine Neuro: Moves all four extremities with no focal neurological deficit  OSE: Head  Cervical  Thoracic T4-5 FSRl Rib Rib 4-5 inhaled on left Lumbar L3 ESRR Sacrum L/L torsion Pelvis   Right ant innom    Skin:    General: Skin is warm and dry.     Capillary Refill: Capillary refill takes less than 2 seconds.  Neurological:     General: No focal deficit present.     Mental Status: She is alert.  Psychiatric:        Mood and Affect: Mood normal.        Behavior: Behavior normal.        Thought Content: Thought content normal.        Assessment & Plan:  1. Lumbar back pain - AMB referral to sports medicine  2. Somatic dysfunction of spine, thoracic 3. Somatic dysfunction of rib 4. Somatic dysfunction of lumbar region 5. Somatic dysfunction of sacral region 6. Somatic dysfunction of pelvis region OMT done after patient permission. HVLA technique utilized. 5 areas treated with improvement of tissue texture and joint mobility. Patient tolerated procedure well.  Recommended heat/ice for the next 24 hours.  Refer to sports medicine.

## 2022-10-06 NOTE — Progress Notes (Unsigned)
[name] D.Col[place] Mathews Phone: [phone]   Assessment and Plan:     There are no diagnoses linked to this encounter.  ***   Pertinent previous records reviewed include ***   Follow Up: ***     Subjective:   I, Shaunda Tipping, am serving as a Education administrator for Doctor Glennon Mac  Chief Complaint: low back pain   HPI:   10/09/2022 Patient is a 21 year old female complaining of low back pain. Patient states  Relevant Historical Information: ***  Additional pertinent review of systems negative.   Current Outpatient Medications:    acetaminophen (TYLENOL) 325 MG tablet, Take 2 tablets (650 mg total) by mouth every 4 (four) hours as needed (for pain scale < 4). (Patient not taking: Reported on 09/26/2022), Disp: , Rfl:    benzocaine-Menthol (DERMOPLAST) 20-0.5 % AERO, Apply 1 Application topically as needed for irritation (perineal discomfort). (Patient not taking: Reported on 09/26/2022), Disp: 78 g, Rfl: 0   Blood Pressure Monitoring (BLOOD PRESSURE KIT) DEVI, 1 kit by Does not apply route once a week., Disp: 1 each, Rfl: 0   coconut oil OIL, Apply 1 Application topically as needed. (Patient not taking: Reported on 09/26/2022), Disp: , Rfl: 0   ibuprofen (ADVIL) 600 MG tablet, Take 1 tablet (600 mg total) by mouth every 6 (six) hours. (Patient not taking: Reported on 09/26/2022), Disp: 30 tablet, Rfl: 0   Magnesium Oxide -Mg Supplement (MAG-OXIDE) 200 MG TABS, Take 2 tablets (400 mg total) by mouth at bedtime. If that amount causes loose stools in the am, switch to 238m daily at bedtime. (Patient not taking: Reported on 09/26/2022), Disp: 60 tablet, Rfl: 3   Prenatal Vit-Fe Phos-FA-Omega (VITAFOL GUMMIES) 3.33-0.333-34.8 MG CHEW, Chew 3 tablets by mouth daily., Disp: 90 tablet, Rfl: 11   senna-docusate (SENOKOT-S) 8.6-50 MG tablet, Take 2 tablets by mouth daily. (Patient not taking: Reported on  09/26/2022), Disp: 30 tablet, Rfl: 0   Objective:     There were no vitals filed for this visit.    There is no height or weight on file to calculate BMI.    Physical Exam:    ***   Electronically signed by:  BBenito MccreedyD.OMarguerita MerlesSports Medicine 7:50 AM 10/06/22

## 2022-10-09 ENCOUNTER — Ambulatory Visit (INDEPENDENT_AMBULATORY_CARE_PROVIDER_SITE_OTHER): Payer: Medicaid Other

## 2022-10-09 ENCOUNTER — Ambulatory Visit (INDEPENDENT_AMBULATORY_CARE_PROVIDER_SITE_OTHER): Payer: Medicaid Other | Admitting: Sports Medicine

## 2022-10-09 VITALS — BP 130/82 | HR 76 | Ht 63.0 in | Wt 232.0 lb

## 2022-10-09 DIAGNOSIS — M2550 Pain in unspecified joint: Secondary | ICD-10-CM

## 2022-10-09 DIAGNOSIS — M545 Low back pain, unspecified: Secondary | ICD-10-CM

## 2022-10-09 DIAGNOSIS — M5413 Radiculopathy, cervicothoracic region: Secondary | ICD-10-CM

## 2022-10-09 DIAGNOSIS — M5441 Lumbago with sciatica, right side: Secondary | ICD-10-CM

## 2022-10-09 DIAGNOSIS — G8929 Other chronic pain: Secondary | ICD-10-CM

## 2022-10-09 DIAGNOSIS — M546 Pain in thoracic spine: Secondary | ICD-10-CM | POA: Diagnosis not present

## 2022-10-09 DIAGNOSIS — M542 Cervicalgia: Secondary | ICD-10-CM

## 2022-10-09 NOTE — Patient Instructions (Addendum)
Good to see you  Labs on the way out  Pt referral  MRI referral  Follow up 3 days after MRI to discuss results

## 2022-10-12 ENCOUNTER — Telehealth: Payer: Self-pay | Admitting: Sports Medicine

## 2022-10-12 NOTE — Telephone Encounter (Signed)
Called and left a VM for patient to call back and notify her that her MRIs were denied. She had no red flag symptoms and we will wait the 6 weeks and make a new treatment plan. Ria Comment will call Sydney Hart and cancel her appointment

## 2022-10-13 NOTE — Telephone Encounter (Signed)
Patient called back.  Appointment scheduled for December 11th (6 weeks from first visit).
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
## 2022-10-17 ENCOUNTER — Other Ambulatory Visit: Payer: Medicaid Other

## 2022-10-18 ENCOUNTER — Ambulatory Visit: Payer: Medicaid Other | Attending: Sports Medicine

## 2022-10-18 ENCOUNTER — Other Ambulatory Visit: Payer: Self-pay

## 2022-10-18 DIAGNOSIS — M5441 Lumbago with sciatica, right side: Secondary | ICD-10-CM | POA: Diagnosis not present

## 2022-10-18 DIAGNOSIS — M5413 Radiculopathy, cervicothoracic region: Secondary | ICD-10-CM | POA: Diagnosis not present

## 2022-10-18 DIAGNOSIS — M2550 Pain in unspecified joint: Secondary | ICD-10-CM | POA: Insufficient documentation

## 2022-10-18 DIAGNOSIS — M542 Cervicalgia: Secondary | ICD-10-CM | POA: Insufficient documentation

## 2022-10-18 DIAGNOSIS — M5459 Other low back pain: Secondary | ICD-10-CM | POA: Insufficient documentation

## 2022-10-18 DIAGNOSIS — M546 Pain in thoracic spine: Secondary | ICD-10-CM | POA: Diagnosis present

## 2022-10-18 DIAGNOSIS — G8929 Other chronic pain: Secondary | ICD-10-CM | POA: Insufficient documentation

## 2022-10-22 ENCOUNTER — Emergency Department (HOSPITAL_BASED_OUTPATIENT_CLINIC_OR_DEPARTMENT_OTHER)
Admission: EM | Admit: 2022-10-22 | Discharge: 2022-10-22 | Disposition: A | Discharge status: Home / Self Care | Payer: Medicaid Other | Attending: Emergency Medicine | Admitting: Emergency Medicine

## 2022-10-22 ENCOUNTER — Other Ambulatory Visit: Payer: Self-pay

## 2022-10-22 ENCOUNTER — Encounter (HOSPITAL_BASED_OUTPATIENT_CLINIC_OR_DEPARTMENT_OTHER): Payer: Self-pay | Admitting: Pediatrics

## 2022-10-22 DIAGNOSIS — G8929 Other chronic pain: Secondary | ICD-10-CM

## 2022-10-22 DIAGNOSIS — B9689 Other specified bacterial agents as the cause of diseases classified elsewhere: Secondary | ICD-10-CM | POA: Insufficient documentation

## 2022-10-22 DIAGNOSIS — N760 Acute vaginitis: Secondary | ICD-10-CM | POA: Insufficient documentation

## 2022-10-22 DIAGNOSIS — M545 Low back pain, unspecified: Secondary | ICD-10-CM | POA: Diagnosis not present

## 2022-10-22 DIAGNOSIS — R109 Unspecified abdominal pain: Secondary | ICD-10-CM | POA: Diagnosis present

## 2022-10-22 LAB — COMPREHENSIVE METABOLIC PANEL
ALT: 34 U/L (ref 0–44)
AST: 31 U/L (ref 15–41)
Albumin: 3.7 g/dL (ref 3.5–5.0)
Alkaline Phosphatase: 78 U/L (ref 38–126)
Anion gap: 6 (ref 5–15)
BUN: 6 mg/dL (ref 6–20)
CO2: 24 mmol/L (ref 22–32)
Calcium: 8.8 mg/dL — ABNORMAL LOW (ref 8.9–10.3)
Chloride: 108 mmol/L (ref 98–111)
Creatinine, Ser: 0.77 mg/dL (ref 0.44–1.00)
GFR, Estimated: >60 mL/min (ref >60)
Glucose, Bld: 93 mg/dL (ref 70–99)
Potassium: 3.5 mmol/L (ref 3.5–5.1)
Sodium: 138 mmol/L (ref 135–145)
Total Bilirubin: 0.7 mg/dL (ref 0.3–1.2)
Total Protein: 7.3 g/dL (ref 6.5–8.1)

## 2022-10-22 LAB — URINALYSIS, MICROSCOPIC (REFLEX)

## 2022-10-22 LAB — WET PREP, GENITAL
Sperm: NONE SEEN
Trich, Wet Prep: NONE SEEN
WBC, Wet Prep HPF POC: <10 (ref <10)
Yeast Wet Prep HPF POC: NONE SEEN

## 2022-10-22 LAB — CBC
HCT: 33.5 % — ABNORMAL LOW (ref 36.0–46.0)
Hemoglobin: 10.9 g/dL — ABNORMAL LOW (ref 12.0–15.0)
MCH: 23.2 pg — ABNORMAL LOW (ref 26.0–34.0)
MCHC: 32.5 g/dL (ref 30.0–36.0)
MCV: 71.3 fL — ABNORMAL LOW (ref 80.0–100.0)
Platelets: 431 10*3/uL — ABNORMAL HIGH (ref 150–400)
RBC: 4.7 MIL/uL (ref 3.87–5.11)
RDW: 16.3 % — ABNORMAL HIGH (ref 11.5–15.5)
WBC: 9.4 10*3/uL (ref 4.0–10.5)
nRBC: 0 % (ref 0.0–0.2)

## 2022-10-22 LAB — URINALYSIS, ROUTINE W REFLEX MICROSCOPIC
Bilirubin Urine: NEGATIVE
Glucose, UA: NEGATIVE mg/dL
Ketones, ur: NEGATIVE mg/dL
Leukocytes,Ua: NEGATIVE
Nitrite: NEGATIVE
Protein, ur: NEGATIVE mg/dL
Specific Gravity, Urine: 1.02 (ref 1.005–1.030)
pH: 6 (ref 5.0–8.0)

## 2022-10-22 LAB — PREGNANCY, URINE: Preg Test, Ur: NEGATIVE

## 2022-10-22 LAB — LIPASE, BLOOD: Lipase: 39 U/L (ref 11–51)

## 2022-10-22 MED ORDER — METRONIDAZOLE 500 MG PO TABS: 500.0000 mg | ORAL_TABLET | Freq: Two times a day (BID) | ORAL | 0 refills | Status: DC

## 2022-10-22 MED ORDER — ACETAMINOPHEN 500 MG PO TABS
1000.0000 mg | ORAL_TABLET | Freq: Once | ORAL | Status: AC
  Administered 2022-10-22: 1000 mg via ORAL
  Filled 2022-10-22: qty 2

## 2022-10-22 MED ORDER — METRONIDAZOLE 500 MG PO TABS
500.0000 mg | ORAL_TABLET | Freq: Once | ORAL | Status: AC
  Administered 2022-10-22: 500 mg via ORAL
  Filled 2022-10-22: qty 1

## 2022-10-22 NOTE — ED Triage Notes (Signed)
C/O lower abdominal pain and pressure and lower back pain; recent vaginal pain 08/09/2022;

## 2022-10-22 NOTE — ED Provider Notes (Signed)
[facility] Provider Note   CSN: [id number] Arrival date & time: 10/22/22  1544     History {Add pertinent medical, surgical, social history, OB history to HPI:1} Chief Complaint  Patient presents with   Back Pain   Abdominal Pain    Sydney Hart is a 21 y.o. female with a past medical history of migraine, herniated lumbar disc presenting to the emergency department for evaluation of back pain and lower abdominal pain.  Patient states she started to have lower abdominal pain after giving birth to her first son 3 months ago.  Reports vaginal bleeding for 6 weeks after delivery.  Patient has been seen by OB/GYN who recommended watchful waiting.  Since then the pain has been constant, sharp, located on the lower abdomen.  Denies any vaginal discharge.  Patient is sexually active reported last sexual intercourse during pregnancy.  Patient has tried pain medication, cold and heat packs with minimal improvement.  The pain is worse when patient laying down or sitting up.  Patient also has chronic back pain in the last 2 years.  She has seen a back specialist many time however the pain has not resolved.  Denies any chest pain, shortness of breath, fever, nausea, vomiting, bowel changes, urinary symptoms.   Back Pain Associated symptoms: abdominal pain   Abdominal Pain      Home Medications Prior to Admission medications   Medication Sig Start Date End Date Taking? Authorizing Provider  acetaminophen (TYLENOL) 325 MG tablet Take 2 tablets (650 mg total) by mouth every 4 (four) hours as needed (for pain scale < 4). 08/11/22   Janice Norrie, MD  benzocaine-Menthol (DERMOPLAST) 20-0.5 % AERO Apply 1 Application topically as needed for irritation (perineal discomfort). 08/11/22   Mercado-Ortiz, Ernestine Conrad, DO  Blood Pressure Monitoring (BLOOD PRESSURE KIT) DEVI 1 kit by Does not apply route once a week. 02/20/22   Constant, Peggy, MD  coconut oil OIL Apply 1  Application topically as needed. 08/11/22   Janice Norrie, MD  ibuprofen (ADVIL) 600 MG tablet Take 1 tablet (600 mg total) by mouth every 6 (six) hours. 08/11/22   Janice Norrie, MD  Magnesium Oxide -Mg Supplement (MAG-OXIDE) 200 MG TABS Take 2 tablets (400 mg total) by mouth at bedtime. If that amount causes loose stools in the am, switch to 257m daily at bedtime. 07/06/22   FJohnston Ebbs NP  Prenatal Vit-Fe Phos-FA-Omega (VITAFOL GUMMIES) 3.33-0.333-34.8 MG CHEW Chew 3 tablets by mouth daily. 02/20/22   Constant, Peggy, MD  senna-docusate (SENOKOT-S) 8.6-50 MG tablet Take 2 tablets by mouth daily. 08/11/22   Mercado-Ortiz, JErnestine Conrad DO      Allergies    Other    Review of Systems   Review of Systems  Gastrointestinal:  Positive for abdominal pain.  Musculoskeletal:  Positive for back pain.    Physical Exam Updated Vital Signs BP 138/84 (BP Location: Right Arm)   Pulse 83   Temp 98.4 F (36.9 C) (Oral)   Resp 18   Ht 5' 3.5" (1.613 m)   Wt 105.2 kg   LMP 10/18/2022   SpO2 100%   BMI 40.45 kg/m  Physical Exam Vitals and nursing note reviewed.  Constitutional:      Appearance: Normal appearance.  HENT:     Head: Normocephalic and atraumatic.     Mouth/Throat:     Mouth: Mucous membranes are moist.  Eyes:     General: No scleral icterus. Cardiovascular:     Rate  and Rhythm: Normal rate and regular rhythm.     Pulses: Normal pulses.     Heart sounds: Normal heart sounds.  Pulmonary:     Effort: Pulmonary effort is normal.     Breath sounds: Normal breath sounds.  Abdominal:     General: Abdomen is flat.     Palpations: Abdomen is soft.     Tenderness: There is abdominal tenderness in the suprapubic area.  Musculoskeletal:        General: No deformity.  Skin:    General: Skin is warm.     Findings: No rash.  Neurological:     General: No focal deficit present.     Mental Status: She is alert.  Psychiatric:        Mood and Affect: Mood normal.     ED Results  / Procedures / Treatments   Labs (all labs ordered are listed, but only abnormal results are displayed) Labs Reviewed  LIPASE, BLOOD  COMPREHENSIVE METABOLIC PANEL  CBC  URINALYSIS, ROUTINE W REFLEX MICROSCOPIC  PREGNANCY, URINE  GC/CHLAMYDIA PROBE AMP ([place]) NOT AT [facility]    EKG None  Radiology No results found.  Procedures Pelvic exam  Date/Time: 10/22/2022 5:31 PM  Performed by: Rex Kras, PA Authorized by: Rex Kras, PA  Consent: Verbal consent obtained. Consent given by: patient Patient understanding: patient states understanding of the procedure being performed Patient consent: the patient's understanding of the procedure matches consent given Procedure consent: procedure consent matches procedure scheduled Relevant documents: relevant documents present and verified Patient identity confirmed: verbally with patient and hospital-assigned identification number Preparation: Patient was prepped and draped in the usual sterile fashion. Local anesthesia used: no  Anesthesia: Local anesthesia used: no  Sedation: Patient sedated: no  Patient tolerance: patient tolerated the procedure well with no immediate complications Comments: Candie, RN at bedside as chaperone during the entire time of the procedure.  No erythema, blood or evidence of vaginal laceration.  Cervix with normal appearance.  No mass.  Negative cervical motion tenderness.     {Document cardiac monitor, telemetry assessment procedure when appropriate:1}  Medications Ordered in ED Medications  acetaminophen (TYLENOL) tablet 1,000 mg (has no administration in time range)    ED Course/ Medical Decision Making/ A&P                           Medical Decision Making Amount and/or Complexity of Data Reviewed Labs: ordered.  Risk OTC drugs.   This patient presents to the ED for lower abdominal pain, this involves an extensive number of treatment options, and is a complaint that carries with a high  risk of complications and morbidity.  The differential diagnosis includes UTI, BV, trichomonas, STD, infectious etiology.  This is not an exhaustive list.  Comorbidities that complicate the patient evaluation See HPI  Social determinants of health NA  Additional history obtained: Additional history obtained from EMR. External records from outside source obtained and review including prior labs  Cardiac monitoring/EKG: The patient was maintained on a cardiac monitor.  I personally reviewed and interpreted the cardiac monitor which showed an underlying rhythm of: Sinus rhythm.  Lab tests: I ordered and personally interpreted labs.  The pertinent results include WBC normal. Hbg normal. Platelets normal. No electrolyte abnormalities noted. BUN, creatinine normal. No transaminitis. UA significant for no acute abnormality.  Wet prep positive for clue cells.  Imaging studies:  Problem list/ ED course/ Critical interventions/ Medical management: HPI: See  above Vital signs otherwise within normal range and stable throughout visit. Laboratory/imaging studies significant for: See above. On physical examination, patient is afebrile and appears in no acute distress.  Pelvic exam unremarkable.  No erythema, blood or evidence of vaginal lacerations.  Cervix with normal appearance, no mass.  Negative cervical motion tenderness.  PID unlikely. UA unremarkable.  Wet prep positive for clue cells.  Patient's clinical presentations and laboratory/imaging studies are most concerned for bacterial vaginosis.  Appendicitis unlikely as patient has no nausea, vomiting, fever or focal right lower quadrant tenderness.  I sent a Rx of metronidazole 500 mg twice a day for 7 days. Advised patient to take antibiotics as prescribed.  Advised patient to follow-up with OB/GYN for further evaluation and management.. Tylenol and metronidazole ordered. Reevaluation of the patient after these medications showed that the  patient stayed the same.   I have reviewed the patient home medicines and have made adjustments as needed.  Consultations obtained:  Disposition Continued outpatient therapy. Follow-up with OB/GYN recommended for reevaluation of symptoms. Treatment plan discussed with patient.  Pt acknowledged understanding was agreeable to the plan. Worrisome signs and symptoms were discussed with patient, and patient acknowledged understanding to return to the ED if they noticed these signs and symptoms. Patient was stable upon discharge.   This chart was dictated using voice recognition software.  Despite best efforts to proofread,  errors can occur which can change the documentation meaning.    {Document critical care time when appropriate:1} {Document review of labs and clinical decision tools ie heart score, Chads2Vasc2 etc:1}  {Document your independent review of radiology images, and any outside records:1} {Document your discussion with family members, caretakers, and with consultants:1} {Document social determinants of health affecting pt's care:1} {Document your decision making why or why not admission, treatments were needed:1} Final Clinical Impression(s) / ED Diagnoses Final diagnoses:  None    Rx / DC Orders ED Discharge Orders     None

## 2022-10-22 NOTE — ED Notes (Signed)
D/c paperwork reviewed with pt, including prescription.  No questions or concerns at time of d/c. Ambulatory to ED exit with family, NAD.

## 2022-10-22 NOTE — Discharge Instructions (Addendum)
Please take metronidazole as prescribed. Take tylenol/ibuprofen for pain. I recommend close follow-up with OB-GYN for reevaluation.  Please do not hesitate to return to emergency department if worrisome signs symptoms we discussed become apparent.

## 2022-10-23 LAB — GC/CHLAMYDIA PROBE AMP (~~LOC~~) NOT AT ARMC
Chlamydia: NEGATIVE
Comment: NEGATIVE
Comment: NORMAL
Neisseria Gonorrhea: NEGATIVE
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
## 2022-10-25 ENCOUNTER — Ambulatory Visit: Payer: Medicaid Other

## 2022-10-30 ENCOUNTER — Ambulatory Visit: Payer: Medicaid Other

## 2022-10-30 DIAGNOSIS — M542 Cervicalgia: Secondary | ICD-10-CM

## 2022-10-30 DIAGNOSIS — M5459 Other low back pain: Secondary | ICD-10-CM | POA: Diagnosis not present

## 2022-10-30 DIAGNOSIS — M546 Pain in thoracic spine: Secondary | ICD-10-CM
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
## 2022-11-08 ENCOUNTER — Ambulatory Visit: Payer: Medicaid Other
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
## 2022-11-20 ENCOUNTER — Ambulatory Visit (INDEPENDENT_AMBULATORY_CARE_PROVIDER_SITE_OTHER): Payer: Medicaid Other | Admitting: Sports Medicine

## 2022-11-20 VITALS — BP 132/82 | HR 53 | Ht 63.0 in | Wt 236.0 lb

## 2022-11-20 DIAGNOSIS — M546 Pain in thoracic spine: Secondary | ICD-10-CM | POA: Diagnosis not present

## 2022-11-20 DIAGNOSIS — M5441 Lumbago with sciatica, right side: Secondary | ICD-10-CM | POA: Diagnosis not present

## 2022-11-20 DIAGNOSIS — M542 Cervicalgia: Secondary | ICD-10-CM

## 2022-11-20 DIAGNOSIS — M2550 Pain in unspecified joint: Secondary | ICD-10-CM

## 2022-11-20 DIAGNOSIS — G8929 Other chronic pain: Secondary | ICD-10-CM

## 2022-11-20 DIAGNOSIS — M5413 Radiculopathy, cervicothoracic region: Secondary | ICD-10-CM

## 2022-11-20 LAB — CBC WITH DIFFERENTIAL/PLATELET
Basophils Absolute: 0 10*3/uL (ref 0.0–0.1)
Basophils Relative: 0.3 % (ref 0.0–3.0)
Eosinophils Absolute: 0.3 10*3/uL (ref 0.0–0.7)
Eosinophils Relative: 4.2 % (ref 0.0–5.0)
HCT: 36.2 % (ref 36.0–46.0)
Hemoglobin: 11.6 g/dL — ABNORMAL LOW (ref 12.0–15.0)
Lymphocytes Relative: 37.2 % (ref 12.0–46.0)
Lymphs Abs: 3 10*3/uL (ref 0.7–4.0)
MCHC: 32.2 g/dL (ref 30.0–36.0)
MCV: 70.2 fl — ABNORMAL LOW (ref 78.0–100.0)
Monocytes Absolute: 0.8 10*3/uL (ref 0.1–1.0)
Monocytes Relative: 10.4 % (ref 3.0–12.0)
Neutro Abs: 3.8 10*3/uL (ref 1.4–7.7)
Neutrophils Relative %: 47.9 % (ref 43.0–77.0)
Platelets: 433 10*3/uL — ABNORMAL HIGH (ref 150.0–400.0)
RBC: 5.16 Mil/uL — ABNORMAL HIGH (ref 3.87–5.11)
RDW: 18.1 % — ABNORMAL HIGH (ref 11.5–14.6)
WBC: 7.9 10*3/uL (ref 4.5–10.5)

## 2022-11-20 LAB — SEDIMENTATION RATE: Sed Rate: 70 mm/hr — ABNORMAL HIGH (ref 0–20)

## 2022-11-20 NOTE — Patient Instructions (Addendum)
Good to see you Labs on the way out  MRI cervical thoracic and lumbar

## 2022-11-21 LAB — COMPREHENSIVE METABOLIC PANEL
ALT: 23 U/L (ref 0–35)
AST: 22 U/L (ref 0–37)
Albumin: 4.2 g/dL (ref 3.5–5.2)
Alkaline Phosphatase: 84 U/L (ref 39–117)
BUN: 12 mg/dL (ref 6–23)
CO2: 26 mEq/L (ref 19–32)
Calcium: 9.6 mg/dL (ref 8.4–10.5)
Chloride: 105 mEq/L (ref 96–112)
Creatinine, Ser: 0.85 mg/dL (ref 0.40–1.20)
GFR: 98.74 mL/min (ref >60.00)
Glucose, Bld: 82 mg/dL (ref 70–99)
Potassium: 4.1 mEq/L (ref 3.5–5.1)
Sodium: 139 mEq/L (ref 135–145)
Total Bilirubin: 0.4 mg/dL (ref 0.2–1.2)
Total Protein: 7.7 g/dL (ref 6.0–8.3)

## 2022-11-21 LAB — VITAMIN D 25 HYDROXY (VIT D DEFICIENCY, FRACTURES): VITD: 25.3 ng/mL — ABNORMAL LOW (ref 30.00–100.00)

## 2022-11-21 LAB — FERRITIN: Ferritin: 7.6 ng/mL — ABNORMAL LOW (ref 10.0–291.0)

## 2022-11-21 LAB — TSH: TSH: 1.13 u[IU]/mL (ref 0.35–5.50)

## 2022-11-21 LAB — URIC ACID: Uric Acid, Serum: 5 mg/dL (ref 2.4–7.0)

## 2022-11-21 LAB — C-REACTIVE PROTEIN: CRP: 1.2 mg/dL (ref 0.5–20.0)

## 2022-11-22 ENCOUNTER — Ambulatory Visit: Payer: Medicaid Other | Attending: Sports Medicine

## 2022-11-22 DIAGNOSIS — M542 Cervicalgia: Secondary | ICD-10-CM | POA: Insufficient documentation

## 2022-11-22 DIAGNOSIS — M5459 Other low back pain: Secondary | ICD-10-CM | POA: Diagnosis present

## 2022-11-22 DIAGNOSIS — M546 Pain in thoracic spine: Secondary | ICD-10-CM | POA: Diagnosis present

## 2022-11-22 LAB — ANA: Anti Nuclear Antibody (ANA): NEGATIVE

## 2022-11-22 LAB — CYCLIC CITRUL PEPTIDE ANTIBODY, IGG: Cyclic Citrullin Peptide Ab: <16 UNITS

## 2022-11-22 LAB — RHEUMATOID FACTOR: Rheumatoid fact SerPl-aCnc: <14 IU/mL (ref <14)
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
## 2022-11-28 ENCOUNTER — Telehealth: Payer: Self-pay | Admitting: Sports Medicine

## 2022-11-28 ENCOUNTER — Other Ambulatory Visit: Payer: Self-pay | Admitting: Sports Medicine

## 2022-11-28 DIAGNOSIS — M2550 Pain in unspecified joint: Secondary | ICD-10-CM

## 2022-11-28 DIAGNOSIS — M5413 Radiculopathy, cervicothoracic region: Secondary | ICD-10-CM

## 2022-11-28 DIAGNOSIS — G8929 Other chronic pain: Secondary | ICD-10-CM

## 2022-11-28 DIAGNOSIS — M542 Cervicalgia: Secondary | ICD-10-CM

## 2022-11-28 NOTE — Telephone Encounter (Signed)
Patient called stating that she has been having increasing numbness, pain, and electric feelings in her legs. It causes her legs to lock up and be up able to walk. It seems to be liked to when her back hurts but that is becoming 24/7. She would like to have a referral sent for evaluation of nerves and possibly a nerve conduction study?  Please advise.  - Patient would like a call back when sent or on next steps.

## 2022-11-28 NOTE — Telephone Encounter (Signed)
Left VM letting patient know referral was sent

## 2022-11-29 ENCOUNTER — Encounter: Payer: Self-pay | Admitting: Neurology

## 2022-12-15 ENCOUNTER — Encounter: Payer: Self-pay | Admitting: Neurology

## 2022-12-15 ENCOUNTER — Ambulatory Visit (INDEPENDENT_AMBULATORY_CARE_PROVIDER_SITE_OTHER): Payer: Medicaid Other | Admitting: Neurology

## 2022-12-15 VITALS — BP 106/71 | HR 83 | Resp 18 | Ht 63.5 in | Wt 238.0 lb

## 2022-12-15 DIAGNOSIS — M79605 Pain in left leg: Secondary | ICD-10-CM | POA: Diagnosis not present

## 2022-12-15 DIAGNOSIS — M79604 Pain in right leg: Secondary | ICD-10-CM

## 2022-12-15 DIAGNOSIS — R200 Anesthesia of skin: Secondary | ICD-10-CM
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
## 2022-12-15 NOTE — Patient Instructions (Signed)

## 2022-12-16 ENCOUNTER — Ambulatory Visit
Admission: RE | Admit: 2022-12-16 | Discharge: 2022-12-16 | Disposition: A | Discharge status: Home / Self Care | Payer: Medicaid Other | Source: Ambulatory Visit | Attending: Sports Medicine | Admitting: Sports Medicine

## 2022-12-16 DIAGNOSIS — G8929 Other chronic pain: Secondary | ICD-10-CM

## 2022-12-16 DIAGNOSIS — M2550 Pain in unspecified joint: Secondary | ICD-10-CM

## 2022-12-16 DIAGNOSIS — M5413 Radiculopathy, cervicothoracic region: Secondary | ICD-10-CM

## 2022-12-16 DIAGNOSIS — M542 Cervicalgia: Secondary | ICD-10-CM

## 2022-12-19 ENCOUNTER — Ambulatory Visit (INDEPENDENT_AMBULATORY_CARE_PROVIDER_SITE_OTHER): Payer: Medicaid Other | Admitting: Neurology

## 2022-12-19 DIAGNOSIS — M79605 Pain in left leg: Secondary | ICD-10-CM

## 2022-12-19 DIAGNOSIS — M79604 Pain in right leg: Secondary | ICD-10-CM | POA: Diagnosis not present

## 2022-12-19 DIAGNOSIS — R200 Anesthesia of skin: Secondary | ICD-10-CM

## 2022-12-19 NOTE — Procedures (Unsigned)
[facility] Neurology  [place], [address]  [place], [place] [address] Tel: [phone] Fax: [phone] Test Date:  12/19/2022  Patient: Sydney Hart DOB: Mar 07, 2001 Physician: Narda Amber, DO  Sex: Female Height: 5\' 3"  Ref Phys: Narda Amber, DO  ID#: [id number]   Technician:    History: This is a 22 year old female referred for evaluation of bilateral leg pain and left leg numbness.  NCV & EMG Findings: Electrodiagnostic testing of the right lower extremity and additional studies of the left shows: Bilateral sural and superficial peroneal sensory responses are within normal limits. Bilateral peroneal and tibial motor responses are within normal limits. Bilateral tibial H reflex studies are within normal limits. There is no evidence of active or chronic motor axonal changes affecting any of the tested muscles.  Motor unit configuration and recruitment pattern is within normal limits.   Impression: This is a normal study of the lower extremities.  In particular, there is no evidence of a large fiber sensorimotor polyneuropathy or lumbosacral radiculopathy.    ___________________________ Narda Amber, DO    Nerve Conduction Studies   Stim Site NR Peak (ms) Norm Peak (ms) O-P Amp (V) Norm O-P Amp  Left Sup Peroneal Anti Sensory (Ant Lat Mall)  32 C  12 cm    2.6 <4.4 15.3 >6  Right Sup Peroneal Anti Sensory (Ant Lat Mall)  32 C  12 cm    2.8 <4.4 16.6 >6  Left Sural Anti Sensory (Lat Mall)  32 C  Calf    3.3 <4.4 20.1 >6  Right Sural Anti Sensory (Lat Mall)  32 C  Calf    2.9 <4.4 21.9 >6     Stim Site NR Onset (ms) Norm Onset (ms) O-P Amp (mV) Norm O-P Amp Site1 Site2 Delta-0 (ms) Dist (cm) Vel (m/s) Norm Vel (m/s)  Left Peroneal Motor (Ext Dig Brev)  32 C  Ankle    3.4 <5.5 7.0 >3 B Fib Ankle 7.5 37.0 49 >41  B Fib    10.9  6.8  Poplt B Fib 1.7 10.0 59 >41  Poplt    12.6  6.7         Right Peroneal Motor (Ext Dig Brev)  32 C  Ankle    3.4  <5.5 5.7 >3 B Fib Ankle 7.4 41.0 55 >41  B Fib    10.8  5.4  Poplt B Fib 1.5 8.0 53 >41  Poplt    12.3  5.1         Left Tibial Motor (Abd Hall Brev)  32 C  Ankle    4.5 <5.8 14.5 >8 Knee Ankle 8.8 41.0 47 >41  Knee    13.3  11.7         Right Tibial Motor (Abd Hall Brev)  32 C  Ankle    4.1 <5.8 16.1 >8 Knee Ankle 8.7 43.0 49 >41  Knee    12.8  12.1          Electromyography   Side Muscle Ins.Act Fibs Fasc Recrt Amp Dur Poly Activation Comment  Right AntTibialis Nml Nml Nml Nml Nml Nml Nml Nml N/A  Right Gastroc Nml Nml Nml Nml Nml Nml Nml Nml N/A  Right Flex Dig Long Nml Nml Nml Nml Nml Nml Nml Nml N/A  Right RectFemoris Nml Nml Nml Nml Nml Nml Nml Nml N/A  Right GluteusMed Nml Nml Nml Nml Nml Nml Nml Nml N/A  Left AntTibialis Nml Nml Nml Nml Nml Nml  Nml Nml N/A  Left Gastroc Nml Nml Nml Nml Nml Nml Nml Nml N/A  Left Flex Dig Long Nml Nml Nml Nml Nml Nml Nml Nml N/A  Left RectFemoris Nml Nml Nml Nml Nml Nml Nml Nml N/A  Left GluteusMed Nml Nml Nml Nml Nml Nml Nml Nml N/A      Waveforms:
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
## 2023-01-02 ENCOUNTER — Ambulatory Visit: Payer: Medicaid Other | Admitting: Sports Medicine
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
## 2023-01-03 ENCOUNTER — Ambulatory Visit (INDEPENDENT_AMBULATORY_CARE_PROVIDER_SITE_OTHER): Payer: Medicaid Other | Admitting: Sports Medicine

## 2023-01-03 VITALS — BP 124/80 | HR 98 | Ht 63.0 in | Wt 243.0 lb

## 2023-01-03 DIAGNOSIS — M546 Pain in thoracic spine: Secondary | ICD-10-CM

## 2023-01-03 DIAGNOSIS — M5441 Lumbago with sciatica, right side: Secondary | ICD-10-CM | POA: Diagnosis not present

## 2023-01-03 DIAGNOSIS — M542 Cervicalgia: Secondary | ICD-10-CM

## 2023-01-03 DIAGNOSIS — M2550 Pain in unspecified joint: Secondary | ICD-10-CM | POA: Diagnosis not present

## 2023-01-03 DIAGNOSIS — G8929 Other chronic pain: Secondary | ICD-10-CM

## 2023-01-03 NOTE — Patient Instructions (Addendum)
Good to see you Pain management referral Recommend follow up with PCP and OBGYN  Recommend starting vitamin D supplement and iron supplement  Continue  PT  As needed follow up

## 2023-01-22 ENCOUNTER — Ambulatory Visit: Payer: Medicaid Other | Admitting: Neurology

## 2023-03-30 ENCOUNTER — Telehealth: Payer: Self-pay | Admitting: Sports Medicine

## 2023-03-30 NOTE — Telephone Encounter (Signed)
Pt has follow up questions:  Have we recd documentation from her attorney to do a disability rating? I advised her I did not see this in her chart but would check.  Also, she cannot go to the Pain Mgmt we referred her to as, per pt, they only do steroid injections and she had a bad experience in the past. Wants to be referred somewhere else that has different pain mgmt options.  Pt informed provider out of office until Monday PM.

## 2023-04-02 ENCOUNTER — Other Ambulatory Visit: Payer: Self-pay | Admitting: Sports Medicine

## 2023-04-02 DIAGNOSIS — M5413 Radiculopathy, cervicothoracic region: Secondary | ICD-10-CM

## 2023-04-02 DIAGNOSIS — M2550 Pain in unspecified joint: Secondary | ICD-10-CM

## 2023-04-02 DIAGNOSIS — M542 Cervicalgia: Secondary | ICD-10-CM

## 2023-04-02 DIAGNOSIS — G8929 Other chronic pain: Secondary | ICD-10-CM

## 2023-04-02 NOTE — Progress Notes (Unsigned)
New referral placed.

## 2023-04-02 NOTE — Telephone Encounter (Signed)
New referral placed , pt called and left VM to return phone call
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
## 2023-04-05 ENCOUNTER — Ambulatory Visit (INDEPENDENT_AMBULATORY_CARE_PROVIDER_SITE_OTHER): Payer: Medicaid Other | Admitting: Sports Medicine

## 2023-04-05 VITALS — BP 122/80 | HR 98 | Ht 63.0 in | Wt 243.0 lb

## 2023-04-05 DIAGNOSIS — M2550 Pain in unspecified joint: Secondary | ICD-10-CM | POA: Diagnosis not present

## 2023-04-05 DIAGNOSIS — M5441 Lumbago with sciatica, right side: Secondary | ICD-10-CM

## 2023-04-05 DIAGNOSIS — M546 Pain in thoracic spine: Secondary | ICD-10-CM | POA: Diagnosis not present

## 2023-04-05 DIAGNOSIS — M542 Cervicalgia: Secondary | ICD-10-CM | POA: Diagnosis not present

## 2023-04-05 DIAGNOSIS — G8929 Other chronic pain: Secondary | ICD-10-CM

## 2023-04-05 NOTE — Patient Instructions (Addendum)
Good to see you  Recommend following up with primary care for chronic medication management  Continue stationary bike, elliptical, or water aerobics Can restart PT at any time  Pain management [phone]  Follow up as needed

## 2023-04-20 IMAGING — US US MFM OB FOLLOW-UP
1 series · 13 of 28 positions shown · non-contrast
Comparison: none

[Series 1: us mfm ob follow-up · 13 of 62 slices shown]
[im 3/62]
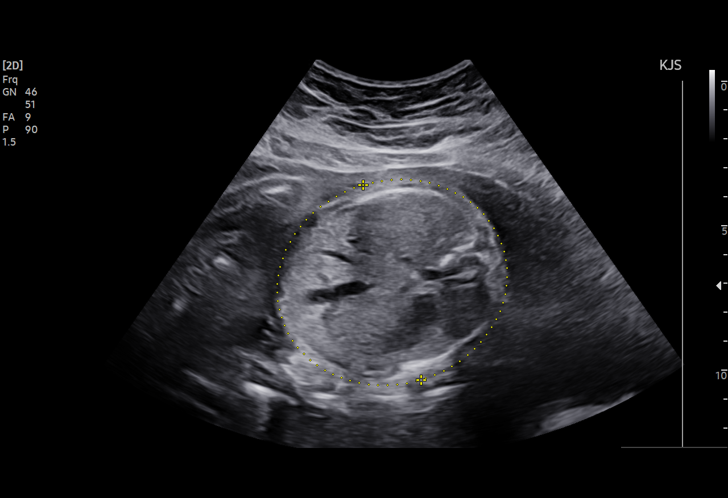
[im 7/62]
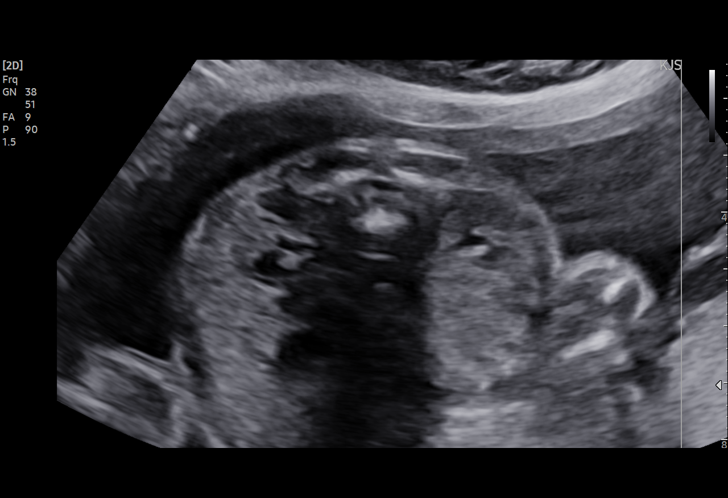
[im 12/62]
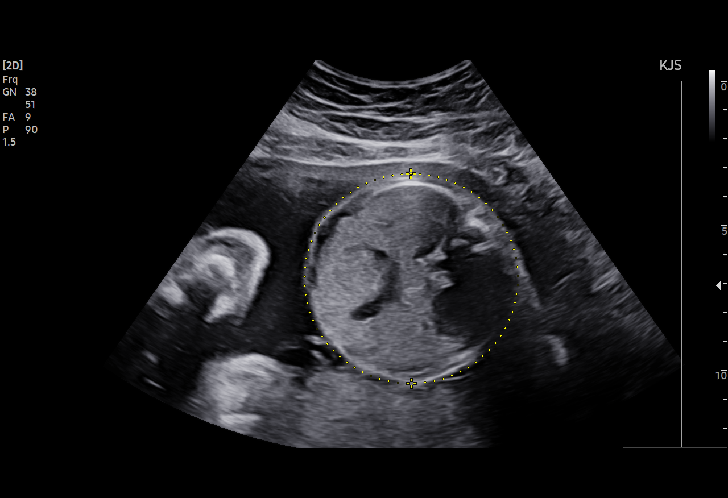
[im 16/62]
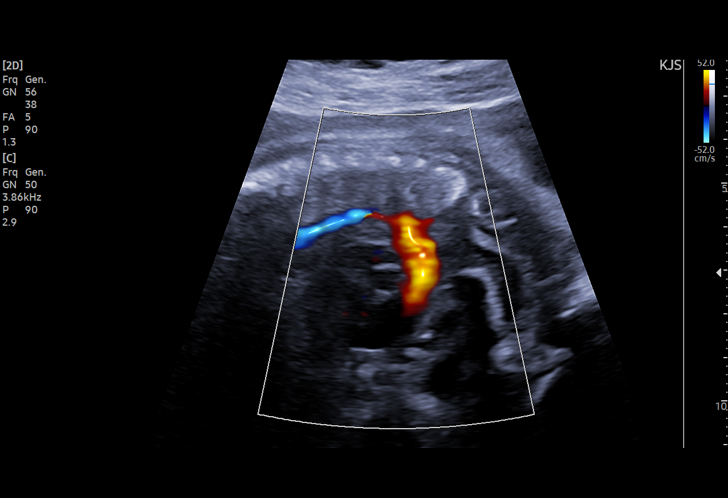
[im 21/62]
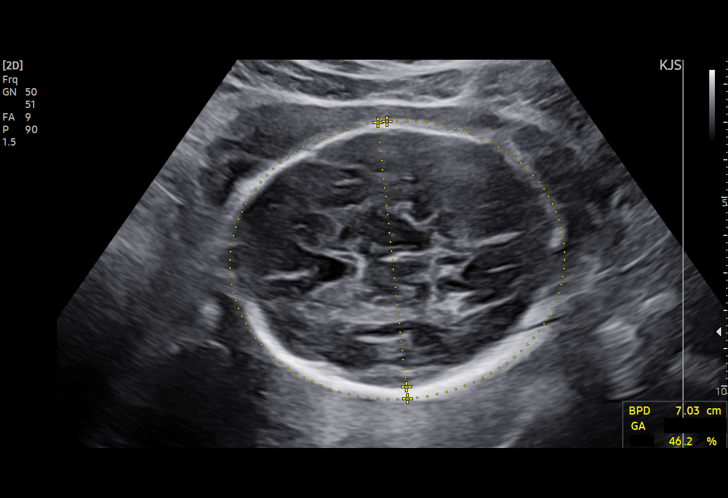
[im 25/62]
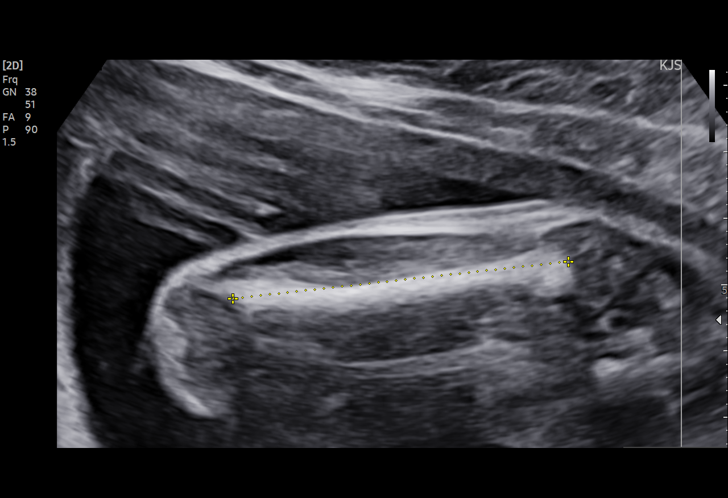
[im 32/62]
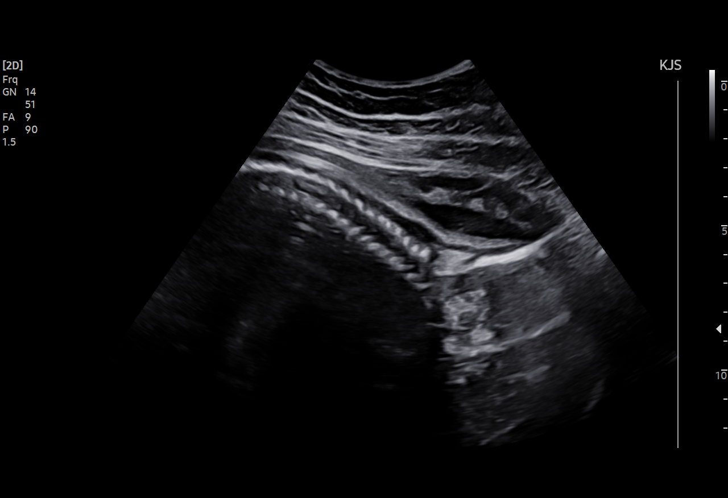
[im 37/62]
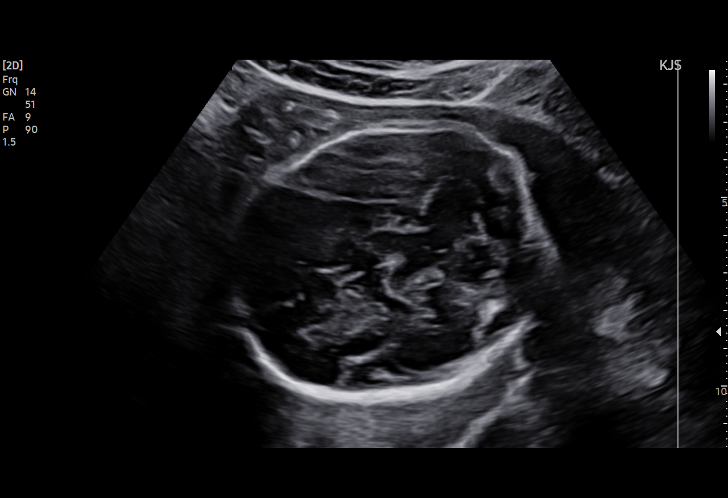
[im 41/62]
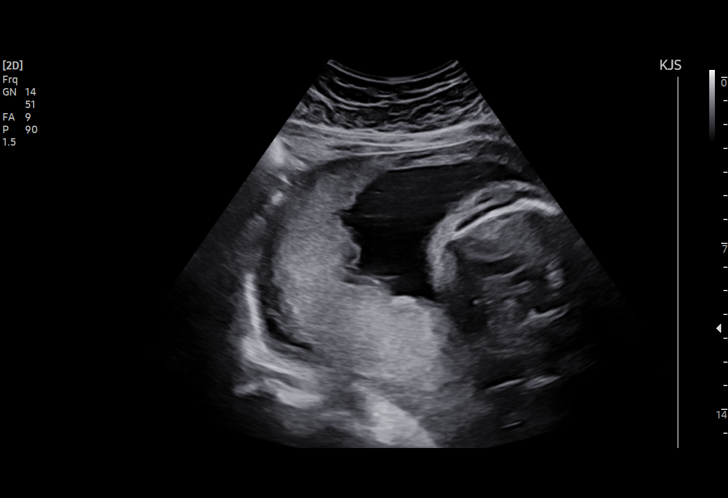
[im 46/62]
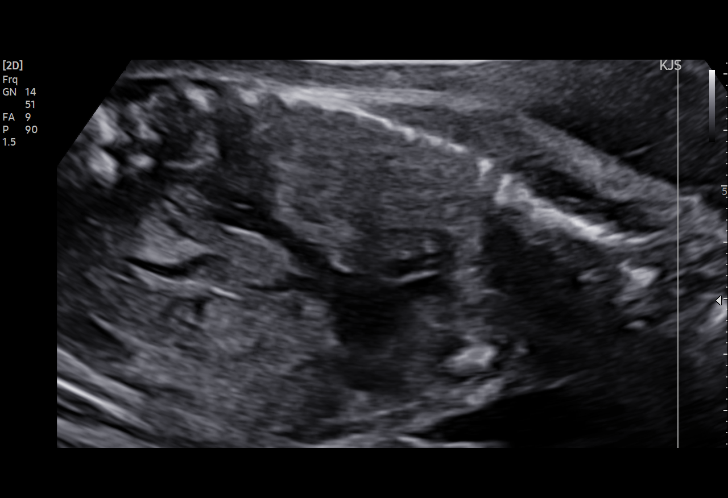
[im 50/62]
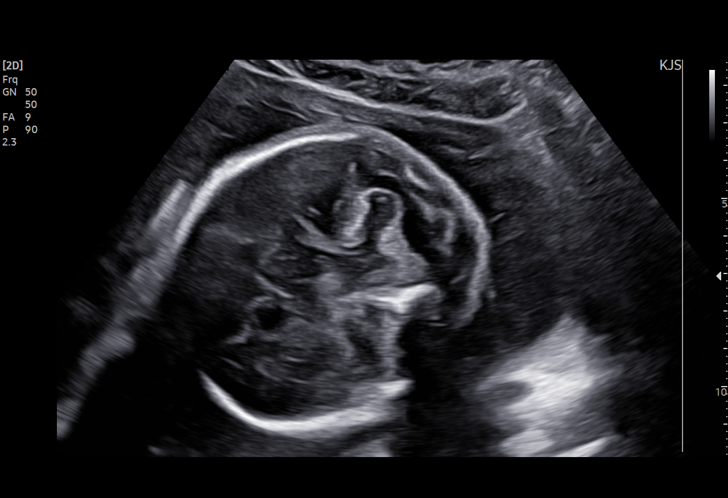
[im 55/62]
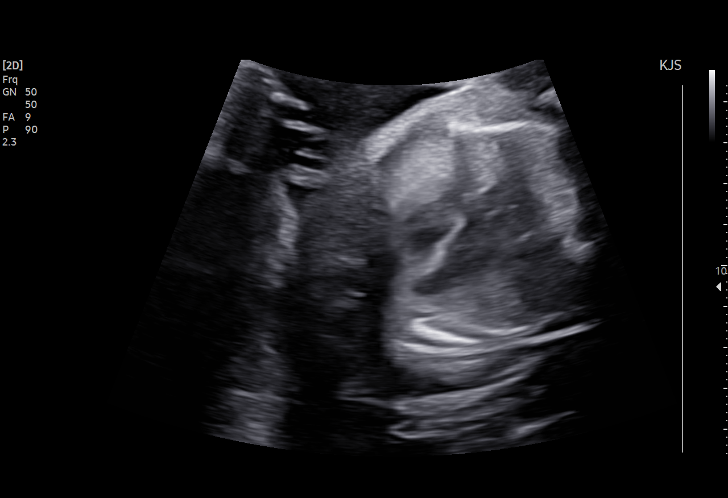
[im 59/62]
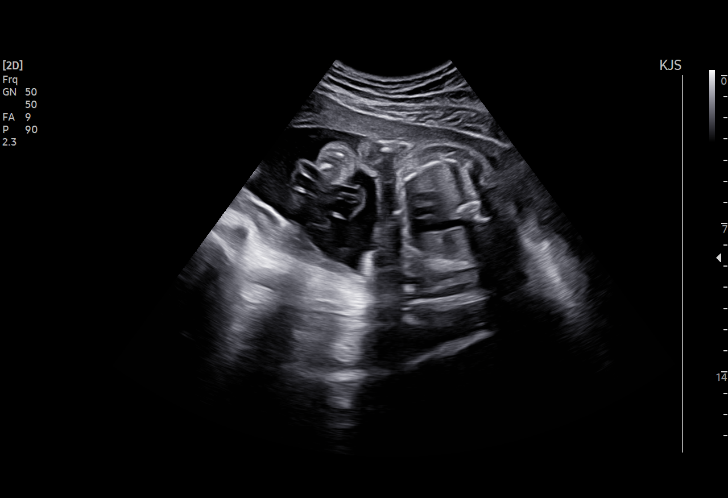

[13 of 28 positions shown; findings below may reference images not displayed]

Ref. Address:     Faculty

Indications

 Obesity complicating pregnancy, third
 trimester (BMI 40)
 Genetic carrier (sickle cell disease)
 Encounter for other antenatal screening
 follow-up
 28 weeks gestation of pregnancy
 LR NIPS/Negative AFP
Fetal Evaluation

 Num Of Fetuses:         1
 Fetal Heart Rate(bpm):  146
 Cardiac Activity:       Observed
 Presentation:           Cephalic
 Placenta:               Posterior
 P. Cord Insertion:      Previously Visualized

 Amniotic Fluid
 AFI FV:      Within normal limits

 AFI Sum(cm)     %Tile       Largest Pocket(cm)
 16.56           61

 RUQ(cm)       RLQ(cm)       LUQ(cm)        LLQ(cm)

Biometry

 BPD:      70.1  mm     G. Age:  28w 1d         43  %    CI:        75.62   %    70 - 86
                                                         FL/HC:      19.8   %    18.8 -
 HC:      255.6  mm     G. Age:  27w 5d         15  %    HC/AC:      1.09        1.05 -
 AC:      235.3  mm     G. Age:  27w 6d         38  %    FL/BPD:     72.2   %    71 - 87
 FL:       50.6  mm     G. Age:  27w 1d         14  %    FL/AC:      21.5   %    20 - 24
 LV:        3.7  mm

 Est. FW:    7779  gm      2 lb 7 oz     24  %
OB History

 Blood Type:   O+
 Gravidity:    1
 Living:       0
Gestational Age

 LMP:           23w 6d        Date:  11/25/21                 EDD:   09/01/22
 U/S Today:     27w 5d                                        EDD:   08/05/22
 Best:          28w 0d     Det. By:  U/S  (02/20/22)          EDD:   08/03/22
Anatomy

 Cranium:               Appears normal         LVOT:                   Previously seen
 Cavum:                 Appears normal         Aortic Arch:            Appears normal
 Ventricles:            Appears normal         Ductal Arch:            Not well visualized
 Choroid Plexus:        Previously seen        Diaphragm:              Appears normal
 Cerebellum:            Previously seen        Stomach:                Appears normal, left
                                                                       sided
 Posterior Fossa:       Previously seen        Abdomen:                Previously seen
 Nuchal Fold:           Not applicable (>20    Abdominal Wall:         Previously seen
                        wks GA)
 Face:                  Orbits and profile     Cord Vessels:           Previously seen
                        previously seen
 Lips:                  Previously seen        Kidneys:                Appear normal
 Palate:                Not well visualized    Bladder:                Appears normal
 Thoracic:              Previously seen        Spine:                  Previously seen
 Heart:                 Previously seen        Upper Extremities:      Previously seen
 RVOT:                  Previously seen        Lower Extremities:      Previously seen

 Other:  Male gender previously seen. Heels/feet, 3VV, 3VTV, nasal bone,
         lenses previously visualized. Technically difficult due to maternal
         habitus and fetal position. Open hands visualized.
Cervix Uterus Adnexa

 Cervix
 Not visualized (advanced GA >75wks)
Comments

 This patient was seen for a follow up growth scan due to
 maternal obesity with a BMI of 41.  She denies any problems
 since her last exam.
 She was informed that the fetal growth and amniotic fluid
 level appears appropriate for her gestational age.
 A follow-up growth scan was scheduled in 4 weeks.
 Due to maternal obesity with a BMI of greater than 40, we will
 start weekly fetal testing at 34 weeks.

## 2023-12-06 ENCOUNTER — Emergency Department (HOSPITAL_BASED_OUTPATIENT_CLINIC_OR_DEPARTMENT_OTHER)
Admission: EM | Admit: 2023-12-06 | Discharge: 2023-12-07 | Disposition: A | Discharge status: Home / Self Care | Payer: Medicaid Other | Attending: Emergency Medicine | Admitting: Emergency Medicine

## 2023-12-06 ENCOUNTER — Encounter (HOSPITAL_BASED_OUTPATIENT_CLINIC_OR_DEPARTMENT_OTHER): Payer: Self-pay

## 2023-12-06 ENCOUNTER — Other Ambulatory Visit: Payer: Self-pay

## 2023-12-06 DIAGNOSIS — R519 Headache, unspecified: Secondary | ICD-10-CM

## 2023-12-06 DIAGNOSIS — B974 Respiratory syncytial virus as the cause of diseases classified elsewhere: Secondary | ICD-10-CM | POA: Insufficient documentation

## 2023-12-06 DIAGNOSIS — J069 Acute upper respiratory infection, unspecified: Secondary | ICD-10-CM | POA: Insufficient documentation

## 2023-12-06 DIAGNOSIS — B338 Other specified viral diseases: Secondary | ICD-10-CM

## 2023-12-06 DIAGNOSIS — Z20822 Contact with and (suspected) exposure to covid-19: Secondary | ICD-10-CM | POA: Insufficient documentation

## 2023-12-06 LAB — RESP PANEL BY RT-PCR (RSV, FLU A&B, COVID)  RVPGX2
Influenza A by PCR: NEGATIVE
Influenza B by PCR: NEGATIVE
Resp Syncytial Virus by PCR: POSITIVE — AB
SARS Coronavirus 2 by RT PCR: NEGATIVE

## 2023-12-06 LAB — GROUP A STREP BY PCR: Group A Strep by PCR: NOT DETECTED

## 2023-12-06 MED ORDER — PROCHLORPERAZINE EDISYLATE 10 MG/2ML IJ SOLN
10.0000 mg | Freq: Once | INTRAMUSCULAR | Status: AC
  Administered 2023-12-06: 10 mg via INTRAVENOUS
  Filled 2023-12-06: qty 2

## 2023-12-06 MED ORDER — DEXAMETHASONE SODIUM PHOSPHATE 10 MG/ML IJ SOLN
10.0000 mg | Freq: Once | INTRAMUSCULAR | Status: AC
  Administered 2023-12-06: 10 mg via INTRAVENOUS
  Filled 2023-12-06: qty 1

## 2023-12-06 MED ORDER — SODIUM CHLORIDE 0.9 % IV BOLUS
1000.0000 mL | Freq: Once | INTRAVENOUS | Status: AC
  Administered 2023-12-06: 1000 mL via INTRAVENOUS

## 2023-12-06 NOTE — ED Triage Notes (Signed)
Patient here POV from Home.  Endorses feeling ill for approximately 3-4 days. Headache initially that has progressed into a cough and hoarseness. Cough is productive and yellow.   Fever yesterday that has since subsided. Sore Throat as well.   NAD Noted during Triage. A&Ox4. GCS 15. Ambulatory.

## 2023-12-06 NOTE — ED Provider Notes (Signed)
[place] [facility] Provider Note   CSN: [id number] Arrival date & time: 12/06/23  2049     History  Chief Complaint  Patient presents with   Headache    Sydney Hart Mee Hives is a 22 y.o. female.  The history is provided by the patient.  Headache She has history of sickle cell trait and comes in with feeling ill for the last 4 days.  She has had fevers as high as 102.  She has a sore throat and a cough productive of mucus which is sometimes clear and sometimes yellow and has occasionally had some blood streaks.  She is complaining of a headache which she thinks is a migraine.  She denies nausea or vomiting.   Home Medications Prior to Admission medications   Medication Sig Start Date End Date Taking? Authorizing Provider  acetaminophen (TYLENOL) 325 MG tablet Take 2 tablets (650 mg total) by mouth every 4 (four) hours as needed (for pain scale < 4). 08/11/22   Wyn Forster, MD  benzocaine-Menthol (DERMOPLAST) 20-0.5 % AERO Apply 1 Application topically as needed for irritation (perineal discomfort). 08/11/22   Mercado-Ortiz, Lahoma Crocker, DO  Blood Pressure Monitoring (BLOOD PRESSURE KIT) DEVI 1 kit by Does not apply route once a week. 02/20/22   Constant, Peggy, MD  coconut oil OIL Apply 1 Application topically as needed. 08/11/22   Wyn Forster, MD  ibuprofen (ADVIL) 600 MG tablet Take 1 tablet (600 mg total) by mouth every 6 (six) hours. 08/11/22   Wyn Forster, MD  Magnesium Oxide -Mg Supplement (MAG-OXIDE) 200 MG TABS Take 2 tablets (400 mg total) by mouth at bedtime. If that amount causes loose stools in the am, switch to 200mg  daily at bedtime. 07/06/22   Corlis Hove, NP  metroNIDAZOLE (FLAGYL) 500 MG tablet Take 1 tablet (500 mg total) by mouth 2 (two) times daily. 10/22/22   Jeanelle Malling, PA  Prenatal Vit-Fe Phos-FA-Omega (VITAFOL GUMMIES) 3.33-0.333-34.8 MG CHEW Chew 3 tablets by mouth daily. 02/20/22   Constant, Peggy, MD  senna-docusate  (SENOKOT-S) 8.6-50 MG tablet Take 2 tablets by mouth daily. 08/11/22   Mercado-Ortiz, Lahoma Crocker, DO      Allergies    Other    Review of Systems   Review of Systems  Neurological:  Positive for headaches.  All other systems reviewed and are negative.   Physical Exam Updated Vital Signs BP 128/85 (BP Location: Right Arm)   Pulse 100   Temp 98.7 F (37.1 C) (Oral)   Resp 18   Ht 5\' 3"  (1.6 m)   Wt 113.4 kg   SpO2 100%   BMI 44.29 kg/m  Physical Exam Vitals and nursing note reviewed.   22 year old female, resting comfortably and in no acute distress. Vital signs are normal. Oxygen saturation is 100%, which is normal. Head is normocephalic and atraumatic. PERRLA, EOMI. Oropharynx is clear.  There is tenderness to palpation of the right temporalis muscle and the insertion of the right paracervical muscles. Neck is nontender and supple without adenopathy . Lungs are clear without rales, wheezes, or rhonchi. Chest is nontender. Heart has regular rate and rhythm without murmur. Abdomen is soft, flat, nontender. Extremities have no cyanosis or edema, full range of motion is present. Skin is warm and dry without rash. Neurologic: Mental status is normal, moves all extremities equally.  ED Results / Procedures / Treatments   Labs (all labs ordered are listed, but only abnormal results are displayed) Labs Reviewed  RESP PANEL BY RT-PCR (RSV, FLU A&B, COVID)  RVPGX2 - Abnormal; Notable for the following components:      Result Value   Resp Syncytial Virus by PCR POSITIVE (*)    All other components within normal limits  GROUP A STREP BY PCR   Procedures Procedures    Medications Ordered in ED Medications  sodium chloride 0.9 % bolus 1,000 mL (0 mLs Intravenous Stopped 12/07/23 0043)  prochlorperazine (COMPAZINE) injection 10 mg (10 mg Intravenous Given 12/06/23 2353)  dexamethasone (DECADRON) injection 10 mg (10 mg Intravenous Given 12/06/23 2353)    ED Course/ Medical  Decision Making/ A&P                                 Medical Decision Making Risk Prescription drug management.   Fever and cough, likely viral illness.  Headache likely part of the viral illness although also has findings suggestive of muscle contraction headache.  No red flags to suggest serious pathology such as subarachnoid hemorrhage or meningitis.  I have reviewed her labs, and my interpretation is positive PCR for RSV.  This is likely what is causing most of her symptoms.  Negative PCR for influenza, COVID-19, strep.  I have ordered a headache cocktail of normal saline solution, prochlorperazine and I have also ordered a dose of dexamethasone.  She feels much better following above-noted treatment.  I am discharging her with instructions to drink plenty of fluids, use over-the-counter NSAIDs and acetaminophen as needed.  Return for worsening symptoms.  Final Clinical Impression(s) / ED Diagnoses Final diagnoses:  RSV infection  Bad headache    Rx / DC Orders ED Discharge Orders     None         Dione Booze, MD 12/07/23 0127

## 2023-12-07 NOTE — Discharge Instructions (Signed)
Drink plenty of fluids.  Take acetaminophen and/or ibuprofen as needed for fever or aching.  Return if you have new or concerning symptoms.

## 2024-01-17 ENCOUNTER — Emergency Department (HOSPITAL_BASED_OUTPATIENT_CLINIC_OR_DEPARTMENT_OTHER)
Admission: EM | Admit: 2024-01-17 | Discharge: 2024-01-17 | Disposition: A | Discharge status: Home / Self Care | Payer: Medicaid Other | Attending: Emergency Medicine | Admitting: Emergency Medicine

## 2024-01-17 ENCOUNTER — Encounter (HOSPITAL_BASED_OUTPATIENT_CLINIC_OR_DEPARTMENT_OTHER): Payer: Self-pay | Admitting: Radiology

## 2024-01-17 ENCOUNTER — Other Ambulatory Visit: Payer: Self-pay

## 2024-01-17 ENCOUNTER — Emergency Department (HOSPITAL_BASED_OUTPATIENT_CLINIC_OR_DEPARTMENT_OTHER): Payer: Medicaid Other

## 2024-01-17 DIAGNOSIS — Y9366 Activity, soccer: Secondary | ICD-10-CM | POA: Diagnosis not present

## 2024-01-17 DIAGNOSIS — X501XXA Overexertion from prolonged static or awkward postures, initial encounter: Secondary | ICD-10-CM | POA: Insufficient documentation

## 2024-01-17 DIAGNOSIS — M25572 Pain in left ankle and joints of left foot: Secondary | ICD-10-CM | POA: Diagnosis present

## 2024-01-17 DIAGNOSIS — G8911 Acute pain due to trauma: Secondary | ICD-10-CM | POA: Insufficient documentation

## 2024-01-17 MED ORDER — NAPROXEN 375 MG PO TABS: 375.0000 mg | ORAL_TABLET | Freq: Two times a day (BID) | ORAL | 0 refills | Status: AC

## 2024-01-17 MED ORDER — NAPROXEN 250 MG PO TABS
500.0000 mg | ORAL_TABLET | Freq: Once | ORAL | Status: AC
  Administered 2024-01-17: 500 mg via ORAL
  Filled 2024-01-17: qty 2

## 2024-01-17 NOTE — ED Triage Notes (Signed)
 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 Pt is able to put weight on but it hurts. Pt comes in wearing a ortho boot from a previous injury.

## 2024-01-17 NOTE — Discharge Instructions (Signed)
 Thank you for letting us  evaluate you today.  Your x-ray was negative for fracture.  This does not rule out a ligamentous injury or sprain.  I provided you with [facility] follow-up for you to follow-up within the next week.  Please make sure to keep it on while walking.  Otherwise please make sure to rest, elevate, ice area to reduce swelling.  I have sent naproxen  to your [facility].  Please do not use ibuprofen , Aleve , aspirin  with naproxen .  Return to emergency department if you experience pale and cool leg to indicate poor circulation

## 2024-01-17 NOTE — ED Provider Notes (Signed)
 [place] [facility] Provider Note   CSN: [id number] Arrival date & time: 01/17/24  2127     History  Chief Complaint  Patient presents with   Ankle Injury    Sydney Hart is a 23 y.o. female with past medical history of migraine, sickle cell trait presents to emergency department for evaluation of left ankle pain following one of the children she was watching was playing soccer and rolled her ankle.  She states that she is able to bear weight on ankle but with pain.  She is currently wearing a cam boot from a previous injury. She denies additional injury, LOC.   Ankle Injury Pertinent negatives include no chest pain, no abdominal pain, no headaches and no shortness of breath.       Home Medications Prior to Admission medications   Medication Sig Start Date End Date Taking? Authorizing Provider  naproxen  (NAPROSYN ) 375 MG tablet Take 1 tablet (375 mg total) by mouth 2 (two) times daily. 01/17/24  Yes Minnie Tinnie BRAVO, PA  acetaminophen  (TYLENOL ) 325 MG tablet Take 2 tablets (650 mg total) by mouth every 4 (four) hours as needed (for pain scale < 4). 08/11/22   Leveque, Alyssa, MD  benzocaine -Menthol  (DERMOPLAST) 20-0.5 % AERO Apply 1 Application topically as needed for irritation (perineal discomfort). 08/11/22   Mercado-Ortiz, Harlene RODES, DO  Blood Pressure Monitoring (BLOOD PRESSURE KIT) DEVI 1 kit by Does not apply route once a week. 02/20/22   Constant, Peggy, MD  coconut oil OIL Apply 1 Application topically as needed. 08/11/22   Leveque, Alyssa, MD  ibuprofen  (ADVIL ) 600 MG tablet Take 1 tablet (600 mg total) by mouth every 6 (six) hours. 08/11/22   Leveque, Alyssa, MD  Magnesium  Oxide -Mg Supplement (MAG-OXIDE) 200 MG TABS Take 2 tablets (400 mg total) by mouth at bedtime. If that amount causes loose stools in the am, switch to 200mg  daily at bedtime. 07/06/22   Forlan, Nicole, NP  metroNIDAZOLE  (FLAGYL ) 500 MG tablet Take 1 tablet (500 mg total)  by mouth 2 (two) times daily. 10/22/22   Ladora Congress, PA  Prenatal Vit-Fe Phos-FA-Omega (VITAFOL  GUMMIES) 3.33-0.333-34.8 MG CHEW Chew 3 tablets by mouth daily. 02/20/22   Constant, Peggy, MD  senna-docusate (SENOKOT-S) 8.6-50 MG tablet Take 2 tablets by mouth daily. 08/11/22   Mercado-Ortiz, Harlene RODES, DO      Allergies    Other    Review of Systems   Review of Systems  Constitutional:  Negative for chills, fatigue and fever.  Respiratory:  Negative for cough, chest tightness, shortness of breath and wheezing.   Cardiovascular:  Negative for chest pain and palpitations.  Gastrointestinal:  Negative for abdominal pain, constipation, diarrhea, nausea and vomiting.  Neurological:  Negative for dizziness, seizures, weakness, light-headedness, numbness and headaches.    Physical Exam Updated Vital Signs BP 137/85 (BP Location: Right Arm)   Pulse 86   Temp 98.1 F (36.7 C) (Oral)   Resp 18   Ht 5' 3 (1.6 m)   Wt 106.6 kg   SpO2 100%   BMI 41.63 kg/m  Physical Exam Vitals and nursing note reviewed.  Constitutional:      General: She is not in acute distress.    Appearance: Normal appearance.  HENT:     Head: Normocephalic and atraumatic.  Eyes:     Conjunctiva/sclera: Conjunctivae normal.  Cardiovascular:     Rate and Rhythm: Normal rate.     Pulses:  Dorsalis pedis pulses are 2+ on the right side and 2+ on the left side.  Pulmonary:     Effort: Pulmonary effort is normal. No respiratory distress.  Musculoskeletal:     Comments: 4/5 of left ankle plantarflexion.  5/5 left ankle dorsiflexion.  No crepitus, obvious deformity, TTP of knees bilaterally, hips bilaterally.  Skin:    General: Skin is warm.     Coloration: Skin is not jaundiced or pale.     Comments: Mild swelling with TTP to lateral malleolus of left ankle.  No ecchymosis, warmth, erythremia, nor infectious signs noted  Neurological:     Mental Status: She is alert and oriented to person, place, and time.  Mental status is at baseline.     Gait: Gait abnormal.     Comments: Dermatomes L2-S2 intact of BLE.  Acting appropriately.     ED Results / Procedures / Treatments   Labs (all labs ordered are listed, but only abnormal results are displayed) Labs Reviewed - No data to display  EKG None  Radiology DG Ankle Complete Left Result Date: 01/17/2024 CLINICAL DATA:  injury EXAM: LEFT ANKLE COMPLETE - 3+ VIEW COMPARISON:  None Available. FINDINGS: There is no evidence of fracture, dislocation, or joint effusion. There is no evidence of arthropathy or other focal bone abnormality. Soft tissues are unremarkable. IMPRESSION: Negative. Electronically Signed   By: Morgane  Naveau M.D.   On: 01/17/2024 21:53    Procedures Procedures    Medications Ordered in ED Medications  naproxen  (NAPROSYN ) tablet 500 mg (has no administration in time range)    ED Course/ Medical Decision Making/ A&P                                 Medical Decision Making Amount and/or Complexity of Data Reviewed Radiology: ordered.   Patient presents to the ED for concern of left ankle pain, this involves an extensive number of treatment options, and is a complaint that carries with it a high risk of complications and morbidity.  The differential diagnosis includes fracture, contusion, sprain, ligamentous injury   Co morbidities that complicate the patient evaluation  None   Additional history obtained:  Additional history obtained from Nursing   External records from outside source obtained and reviewed including triage RN note    Imaging Studies ordered:  I ordered imaging studies including left ankle x-ray I independently visualized and interpreted imaging which showed no fracture, effusion I agree with the radiologist interpretation    Medicines ordered and prescription drug management:  I ordered medication including naprosyn   for pain  Reevaluation of the patient after these medicines showed  that the patient improved I have reviewed the patients home medicines and have made adjustments as needed    Problem List / ED Course:  Acute left ankle pain X-ray negative for fracture Patient has cam boot from previous injury so we will have patient continue using until she follows up with Ortho Discussed ED workup, disposition, return to emergency department cautions with patient expresses understand agrees with plan.  All questions answered to patient's satisfaction.  She is agreeable discharge.   Reevaluation:  After the interventions noted above, I reevaluated the patient and found that they have :improved   Social Determinants of Health:  Has orthopedic follow-up   Dispostion:  After consideration of the diagnostic results and the patients response to treatment, I feel that the patent would benefit from outpatient  management orthopedic follow-up.    Final Clinical Impression(s) / ED Diagnoses Final diagnoses:  Acute left ankle pain    Rx / DC Orders ED Discharge Orders          Ordered    naproxen  (NAPROSYN ) 375 MG tablet  2 times daily        01/17/24 2314              Minnie Tinnie BRAVO, PA 01/17/24 2319    Darra Fonda MATSU, MD 01/23/24 1409

## 2024-03-28 ENCOUNTER — Encounter (HOSPITAL_BASED_OUTPATIENT_CLINIC_OR_DEPARTMENT_OTHER): Payer: Self-pay

## 2024-03-28 ENCOUNTER — Other Ambulatory Visit: Payer: Self-pay

## 2024-03-28 ENCOUNTER — Emergency Department (HOSPITAL_BASED_OUTPATIENT_CLINIC_OR_DEPARTMENT_OTHER)
Admission: EM | Admit: 2024-03-28 | Discharge: 2024-03-28 | Disposition: A | Discharge status: Home / Self Care | Payer: MEDICAID | Attending: Emergency Medicine | Admitting: Emergency Medicine

## 2024-03-28 ENCOUNTER — Emergency Department (HOSPITAL_BASED_OUTPATIENT_CLINIC_OR_DEPARTMENT_OTHER): Payer: MEDICAID

## 2024-03-28 DIAGNOSIS — R059 Cough, unspecified: Secondary | ICD-10-CM | POA: Diagnosis present

## 2024-03-28 DIAGNOSIS — J069 Acute upper respiratory infection, unspecified: Secondary | ICD-10-CM | POA: Diagnosis not present

## 2024-03-28 DIAGNOSIS — R000 Tachycardia, unspecified: Secondary | ICD-10-CM | POA: Insufficient documentation

## 2024-03-28 DIAGNOSIS — G43811 Other migraine, intractable, with status migrainosus: Secondary | ICD-10-CM | POA: Diagnosis not present

## 2024-03-28 LAB — RESP PANEL BY RT-PCR (RSV, FLU A&B, COVID)  RVPGX2
Influenza A by PCR: NEGATIVE
Influenza B by PCR: NEGATIVE
Resp Syncytial Virus by PCR: NEGATIVE
SARS Coronavirus 2 by RT PCR: NEGATIVE

## 2024-03-28 LAB — CBC WITH DIFFERENTIAL/PLATELET
Abs Immature Granulocytes: 0.02 10*3/uL (ref 0.00–0.07)
Basophils Absolute: 0 10*3/uL (ref 0.0–0.1)
Basophils Relative: 0 %
Eosinophils Absolute: 0.3 10*3/uL (ref 0.0–0.5)
Eosinophils Relative: 5 %
HCT: 35.6 % — ABNORMAL LOW (ref 36.0–46.0)
Hemoglobin: 11.9 g/dL — ABNORMAL LOW (ref 12.0–15.0)
Immature Granulocytes: 0 %
Lymphocytes Relative: 20 %
Lymphs Abs: 1.1 10*3/uL (ref 0.7–4.0)
MCH: 26.2 pg (ref 26.0–34.0)
MCHC: 33.4 g/dL (ref 30.0–36.0)
MCV: 78.2 fL — ABNORMAL LOW (ref 80.0–100.0)
Monocytes Absolute: 0.9 10*3/uL (ref 0.1–1.0)
Monocytes Relative: 17 %
Neutro Abs: 3.1 10*3/uL (ref 1.7–7.7)
Neutrophils Relative %: 58 %
Platelets: 294 10*3/uL (ref 150–400)
RBC: 4.55 MIL/uL (ref 3.87–5.11)
RDW: 14.8 % (ref 11.5–15.5)
WBC: 5.4 10*3/uL (ref 4.0–10.5)
nRBC: 0 % (ref 0.0–0.2)

## 2024-03-28 LAB — BASIC METABOLIC PANEL WITH GFR
Anion gap: 7 (ref 5–15)
BUN: 9 mg/dL (ref 6–20)
CO2: 25 mmol/L (ref 22–32)
Calcium: 9.1 mg/dL (ref 8.9–10.3)
Chloride: 102 mmol/L (ref 98–111)
Creatinine, Ser: 0.89 mg/dL (ref 0.44–1.00)
GFR, Estimated: >60 mL/min (ref >60)
Glucose, Bld: 86 mg/dL (ref 70–99)
Potassium: 3.8 mmol/L (ref 3.5–5.1)
Sodium: 134 mmol/L — ABNORMAL LOW (ref 135–145)

## 2024-03-28 MED ORDER — METOCLOPRAMIDE HCL 5 MG/ML IJ SOLN
10.0000 mg | Freq: Once | INTRAMUSCULAR | Status: AC
  Administered 2024-03-28: 10 mg via INTRAVENOUS
  Filled 2024-03-28: qty 2

## 2024-03-28 MED ORDER — LACTATED RINGERS IV BOLUS
1000.0000 mL | Freq: Once | INTRAVENOUS | Status: AC
  Administered 2024-03-28: 1000 mL via INTRAVENOUS

## 2024-03-28 MED ORDER — DEXAMETHASONE SODIUM PHOSPHATE 10 MG/ML IJ SOLN
10.0000 mg | Freq: Once | INTRAMUSCULAR | Status: AC
  Administered 2024-03-28: 10 mg via INTRAVENOUS
  Filled 2024-03-28: qty 1

## 2024-03-28 MED ORDER — ACETAMINOPHEN 500 MG PO TABS
1000.0000 mg | ORAL_TABLET | Freq: Once | ORAL | Status: AC
  Administered 2024-03-28: 1000 mg via ORAL
  Filled 2024-03-28: qty 2

## 2024-03-28 NOTE — Discharge Instructions (Signed)
 All the blood work looks normal today.  Your x-ray did not show any signs of pneumonia and your CAT scan was normal.  It seems that you most likely have the viral infection your son had last week.  Take Tylenol  and ibuprofen  as needed for fever and chills.  Make sure you are drinking plenty of fluids and getting lots of rest.  If your symptoms start getting worse over the weekend and you are not feeling better by Monday you should follow-up with your doctor.

## 2024-03-28 NOTE — ED Triage Notes (Signed)
 Pt c/o migraine since yesterday, worse today. Associated nausea, intermittent dizziness, chest tightness, cough. Advises OTC allergy meds PTA

## 2024-03-28 NOTE — ED Provider Notes (Signed)
 [place] [facility] AT [facility] Provider Note   CSN: [id number] Arrival date & time: 03/28/24  1505     History  Chief Complaint  Patient presents with   Migraine    Robina Hamor Cortez is a 23 y.o. female.  Patient is a 23 year old female with history of chronic migraine headaches but no other acute conditions who is presenting today with multiple complaints.  She reports that she started developing a migraine headache yesterday and it waxed and waned throughout the day and kind of went away but when she woke up this morning it was back and more severe.  It is mostly over her left eye causing her to have photophobia, anorexia and feeling generally unwell.  She reports when she tries to get up and walk she will feel dizzy if her head is moving which improves when she is still but also she will occasionally have some blurred vision and feel like she is stuttering speech which comes and goes.  She reports for some days now she has had nasal congestion, sneezing, cough productive of mucus with some tightness in her chest.  Earlier this week she did do a COVID test which was negative so she thought it was allergies and took a Benadryl  today however she reports she has had chills all day.  She does not take any OCPs and denies any prescription medications at this time.  LMP was 2 weeks ago and she reports at this time she is not sexually active.  No unilateral numbness or weakness.  No neck pain.  The history is provided by the patient.  Migraine       Home Medications Prior to Admission medications   Medication Sig Start Date End Date Taking? Authorizing Provider  acetaminophen  (TYLENOL ) 325 MG tablet Take 2 tablets (650 mg total) by mouth every 4 (four) hours as needed (for pain scale < 4). 08/11/22   Leveque, Alyssa, MD  benzocaine -Menthol  (DERMOPLAST) 20-0.5 % AERO Apply 1 Application topically as needed for irritation (perineal discomfort). 08/11/22   Mercado-Ortiz,  Harlene RODES, DO  Blood Pressure Monitoring (BLOOD PRESSURE KIT) DEVI 1 kit by Does not apply route once a week. 02/20/22   Constant, Peggy, MD  coconut oil OIL Apply 1 Application topically as needed. 08/11/22   Leveque, Alyssa, MD  ibuprofen  (ADVIL ) 600 MG tablet Take 1 tablet (600 mg total) by mouth every 6 (six) hours. 08/11/22   Leveque, Alyssa, MD  Magnesium  Oxide -Mg Supplement (MAG-OXIDE) 200 MG TABS Take 2 tablets (400 mg total) by mouth at bedtime. If that amount causes loose stools in the am, switch to 200mg  daily at bedtime. 07/06/22   Forlan, Nicole, NP  metroNIDAZOLE  (FLAGYL ) 500 MG tablet Take 1 tablet (500 mg total) by mouth 2 (two) times daily. 10/22/22   Ladora Congress, PA  naproxen  (NAPROSYN ) 375 MG tablet Take 1 tablet (375 mg total) by mouth 2 (two) times daily. 01/17/24   Minnie Tinnie BRAVO, PA  Prenatal Vit-Fe Phos-FA-Omega (VITAFOL  GUMMIES) 3.33-0.333-34.8 MG CHEW Chew 3 tablets by mouth daily. 02/20/22   Constant, Peggy, MD  senna-docusate (SENOKOT-S) 8.6-50 MG tablet Take 2 tablets by mouth daily. 08/11/22   Mercado-Ortiz, Harlene RODES, DO      Allergies    Other    Review of Systems   Review of Systems  Physical Exam Updated Vital Signs BP 130/70   Pulse 89   Temp (!) 100.6 F (38.1 C)   Resp 16   LMP 03/16/2024 (Approximate)  SpO2 97%   Breastfeeding No  Physical Exam Vitals and nursing note reviewed.  Constitutional:      General: She is not in acute distress.    Appearance: She is well-developed.  HENT:     Head: Normocephalic and atraumatic.     Mouth/Throat:     Mouth: Mucous membranes are moist.  Eyes:     Extraocular Movements: Extraocular movements intact.     Conjunctiva/sclera: Conjunctivae normal.     Pupils: Pupils are equal, round, and reactive to light.  Cardiovascular:     Rate and Rhythm: Regular rhythm. Tachycardia present.     Pulses: Normal pulses.     Heart sounds: Normal heart sounds. No murmur heard.    No friction rub.  Pulmonary:     Effort:  Pulmonary effort is normal.     Breath sounds: Normal breath sounds. No wheezing or rales.  Abdominal:     General: Bowel sounds are normal. There is no distension.     Palpations: Abdomen is soft.     Tenderness: There is no abdominal tenderness. There is no guarding or rebound.  Musculoskeletal:        General: No tenderness. Normal range of motion.     Cervical back: Normal range of motion and neck supple. No rigidity or tenderness.     Right lower leg: No edema.     Left lower leg: No edema.     Comments: No edema  Skin:    General: Skin is warm and dry.     Findings: No rash.  Neurological:     Mental Status: She is alert and oriented to person, place, and time.     Cranial Nerves: No cranial nerve deficit, dysarthria or facial asymmetry.     Sensory: Sensation is intact. No sensory deficit.     Motor: No weakness or pronator drift.     Coordination: Coordination is intact.     Gait: Gait is intact.  Psychiatric:        Behavior: Behavior normal.     ED Results / Procedures / Treatments   Labs (all labs ordered are listed, but only abnormal results are displayed) Labs Reviewed  CBC WITH DIFFERENTIAL/PLATELET - Abnormal; Notable for the following components:      Result Value   Hemoglobin 11.9 (*)    HCT 35.6 (*)    MCV 78.2 (*)    All other components within normal limits  BASIC METABOLIC PANEL WITH GFR - Abnormal; Notable for the following components:   Sodium 134 (*)    All other components within normal limits  RESP PANEL BY RT-PCR (RSV, FLU A&B, COVID)  [id number]    EKG None  Radiology CT Head Wo Contrast Result Date: 03/28/2024 CLINICAL DATA:  Headache, neuro deficit. EXAM: CT HEAD WITHOUT CONTRAST TECHNIQUE: Contiguous axial images were obtained from the base of the skull through the vertex without intravenous contrast. RADIATION DOSE REDUCTION: This exam was performed according to the departmental dose-optimization program which includes automated exposure  control, adjustment of the mA and/or kV according to patient size and/or use of iterative reconstruction technique. COMPARISON:  None Available. FINDINGS: Brain: No acute intracranial hemorrhage. No CT evidence of acute infarct. No edema, mass effect, or midline shift. The basilar cisterns are patent. Ventricles: The ventricles are normal. Vascular: No hyperdense vessel or unexpected calcification. Skull: No acute or aggressive finding. Orbits: Orbits are symmetric. Sinuses: Mucosal thickening in the bilateral ethmoid sinuses, right greater than left. Additional mucosal thickening  in the partially visualized maxillary sinuses. Other: Mastoid air cells are clear. IMPRESSION: No CT evidence of acute intracranial abnormality. Electronically Signed   By: Donnice Mania M.D.   On: 03/28/2024 18:11   DG Chest Port 1 View Result Date: 03/28/2024 CLINICAL DATA:  Productive cough. EXAM: PORTABLE CHEST 1 VIEW COMPARISON:  04/20/2008. FINDINGS: Low lung volume. Bilateral lung fields are clear. Bilateral costophrenic angles are clear. Stable cardio-mediastinal silhouette. No acute osseous abnormalities. The soft tissues are within normal limits. IMPRESSION: No active disease. Electronically Signed   By: Ree Molt M.D.   On: 03/28/2024 17:03    Procedures Procedures    Medications Ordered in ED Medications  acetaminophen  (TYLENOL ) tablet 1,000 mg (1,000 mg Oral Given 03/28/24 1602)  lactated ringers  bolus 1,000 mL (0 mLs Intravenous Stopped 03/28/24 1641)  metoCLOPramide  (REGLAN ) injection 10 mg (10 mg Intravenous Given 03/28/24 1558)  dexamethasone  (DECADRON ) injection 10 mg (10 mg Intravenous Given 03/28/24 1559)    ED Course/ Medical Decision Making/ A&P                                 Medical Decision Making Amount and/or Complexity of Data Reviewed Labs: ordered. Decision-making details documented in ED Course. Radiology: ordered and independent interpretation performed. Decision-making details  documented in ED Course.  Risk OTC drugs. Prescription drug management.   Pt presenting today with a complaint that caries a high risk for morbidity and mortality. Here today complaining of headache but also of URI and infectious symptoms.  Patient found to have a temperature of 100.6 today in the setting of URI symptoms could be viral etiology including possibly COVID.  However patient is also complaining of a migraine with intermittent stuttering speech dizziness when she tries to move and intermittent blurry vision.  None of which is present at this time.  Lower suspicion for TIA, stroke, subarachnoid hemorrhage or mass.  Suspect migraine as patient already has a history of migraines.  Low risk for venous thrombosis.  Labs, chest x-ray to rule out pneumonia are pending.  Patient given headache cocktail.  7:04 PM On reevaluation patient reports her head is not as bad but she is still having some mild headache.  She still has no neurologic deficits.  I independently interpreted patient's labs and CBC, BMP without acute findings.  Viral panel is negative.  I have independently visualized and interpreted pt's images today.  Chest x-ray without evidence of pneumonia today and head CT wnl.  Radiology reports both images are normal.  Discussed all these findings with the patient.  She is displaying no meningeal findings today.  Her son had been sick with similar symptoms last week and she most likely has a viral illness.  She has full range of motion of her neck.  She is able to walk without any difficulty and still is not displaying any neurologic symptoms at this time she appears stable for discharge home.  She is comfortable with this plan was given return precautions.         Final Clinical Impression(s) / ED Diagnoses Final diagnoses:  Other migraine with status migrainosus, intractable  Viral URI with cough    Rx / DC Orders ED Discharge Orders     None         Doretha Folks,  MD 03/28/24 1905

## 2024-04-02 ENCOUNTER — Encounter (HOSPITAL_BASED_OUTPATIENT_CLINIC_OR_DEPARTMENT_OTHER): Payer: Self-pay

## 2024-04-02 ENCOUNTER — Other Ambulatory Visit: Payer: Self-pay

## 2024-04-02 ENCOUNTER — Emergency Department (HOSPITAL_BASED_OUTPATIENT_CLINIC_OR_DEPARTMENT_OTHER)
Admission: EM | Admit: 2024-04-02 | Discharge: 2024-04-02 | Disposition: A | Discharge status: Home / Self Care | Payer: MEDICAID | Attending: Emergency Medicine | Admitting: Emergency Medicine

## 2024-04-02 DIAGNOSIS — Z23 Encounter for immunization: Secondary | ICD-10-CM | POA: Insufficient documentation

## 2024-04-02 DIAGNOSIS — Z203 Contact with and (suspected) exposure to rabies: Secondary | ICD-10-CM | POA: Insufficient documentation

## 2024-04-02 DIAGNOSIS — S51851A Open bite of right forearm, initial encounter: Secondary | ICD-10-CM | POA: Insufficient documentation

## 2024-04-02 DIAGNOSIS — W5501XA Bitten by cat, initial encounter: Secondary | ICD-10-CM | POA: Insufficient documentation

## 2024-04-02 DIAGNOSIS — Z2914 Encounter for prophylactic rabies immune globin: Secondary | ICD-10-CM | POA: Insufficient documentation

## 2024-04-02 MED ORDER — AMOXICILLIN-POT CLAVULANATE 875-125 MG PO TABS: 1.0000 | ORAL_TABLET | Freq: Two times a day (BID) | ORAL | 0 refills | Status: DC

## 2024-04-02 MED ORDER — RABIES VACCINE, PCEC IM SUSR
1.0000 mL | Freq: Once | INTRAMUSCULAR | Status: AC
  Administered 2024-04-02: 1 mL via INTRAMUSCULAR
  Filled 2024-04-02: qty 1

## 2024-04-02 MED ORDER — AMOXICILLIN-POT CLAVULANATE 875-125 MG PO TABS
1.0000 | ORAL_TABLET | Freq: Once | ORAL | Status: AC
Start: 2024-04-02 — End: 2024-04-02
  Administered 2024-04-02: 1 via ORAL
  Filled 2024-04-02: qty 1

## 2024-04-02 MED ORDER — RABIES IMMUNE GLOBULIN 150 UNIT/ML IM INJ
20.0000 [IU]/kg | INJECTION | Freq: Once | INTRAMUSCULAR | Status: AC
  Administered 2024-04-02: 2175 [IU]
  Filled 2024-04-02: qty 16

## 2024-04-02 NOTE — Discharge Instructions (Signed)
 It was a pleasure taking part in your care.  As discussed, you will need to return to the ED on day 3, 7 and 14 for additional rabies vaccinations.  Today will be considered day 0.  Please return on day 4/26 for the second shot in your vaccination series.

## 2024-04-02 NOTE — ED Provider Notes (Signed)
 [place] EMERGENC[facility] DEPARTMENT AT [facility] Provider Note   CSN: [id number] Arrival date & time: 04/02/24  1722     History  Chief Complaint  Patient presents with   Animal Bite    Sydney Hart is a 23 y.o. female who presents to the ED for evaluation of cat bite to right forearm.  States she was playing with a cat at a friend of hers house.  She states that this cat bit her in her right forearm.  She is unsure of the cat's vaccination status.  She does not know who owns this cat.  She reports that she was told by her friend that this cat is a "stray" that comes around sometimes.  Animal Bite      Home Medications Prior to Admission medications   Medication Sig Start Date End Date Taking? Authorizing Provider  amoxicillin -clavulanate (AUGMENTIN ) 875-125 MG tablet Take 1 tablet by mouth every 12 (twelve) hours. 04/02/24  Yes Adel Aden, PA-C  acetaminophen  (TYLENOL ) 325 MG tablet Take 2 tablets (650 mg total) by mouth every 4 (four) hours as needed (for pain scale < 4). 08/11/22   Leveque, Alyssa, MD  benzocaine -Menthol  (DERMOPLAST) 20-0.5 % AERO Apply 1 Application topically as needed for irritation (perineal discomfort). 08/11/22   Mercado-Ortiz, Lindbergh Reusing, DO  Blood Pressure Monitoring (BLOOD PRESSURE KIT) DEVI 1 kit by Does not apply route once a week. 02/20/22   Constant, Peggy, MD  coconut oil OIL Apply 1 Application topically as needed. 08/11/22   Leveque, Alyssa, MD  ibuprofen  (ADVIL ) 600 MG tablet Take 1 tablet (600 mg total) by mouth every 6 (six) hours. 08/11/22   Leveque, Alyssa, MD  Magnesium  Oxide -Mg Supplement (MAG-OXIDE) 200 MG TABS Take 2 tablets (400 mg total) by mouth at bedtime. If that amount causes loose stools in the am, switch to 200mg  daily at bedtime. 07/06/22   Forlan, Nicole, NP  metroNIDAZOLE  (FLAGYL ) 500 MG tablet Take 1 tablet (500 mg total) by mouth 2 (two) times daily. 10/22/22   Thomes Flicker, PA  naproxen  (NAPROSYN ) 375 MG tablet Take 1  tablet (375 mg total) by mouth 2 (two) times daily. 01/17/24   Royann Cords, PA  Prenatal Vit-Fe Phos-FA-Omega (VITAFOL  GUMMIES) 3.33-0.333-34.8 MG CHEW Chew 3 tablets by mouth daily. 02/20/22   Constant, Peggy, MD  senna-docusate (SENOKOT-S) 8.6-50 MG tablet Take 2 tablets by mouth daily. 08/11/22   Mercado-Ortiz, Lindbergh Reusing, DO      Allergies    Other    Review of Systems   Review of Systems  All other systems reviewed and are negative.   Physical Exam Updated Vital Signs BP 114/77 (BP Location: Left Arm)   Pulse 85   Temp 98.2 F (36.8 C) (Oral)   Resp 16   Ht 5\' 3"  (1.6 m)   Wt 107.9 kg   LMP 03/16/2024 (Approximate)   SpO2 100%   BMI 42.14 kg/m  Physical Exam Vitals and nursing note reviewed.  Constitutional:      General: She is not in acute distress.    Appearance: She is well-developed.  HENT:     Head: Normocephalic and atraumatic.  Eyes:     Conjunctiva/sclera: Conjunctivae normal.  Cardiovascular:     Rate and Rhythm: Normal rate and regular rhythm.     Heart sounds: No murmur heard. Pulmonary:     Effort: Pulmonary effort is normal. No respiratory distress.     Breath sounds: Normal breath sounds.  Abdominal:  Palpations: Abdomen is soft.     Tenderness: There is no abdominal tenderness.  Musculoskeletal:        General: No swelling.     Cervical back: Neck supple.  Skin:    General: Skin is warm and dry.     Capillary Refill: Capillary refill takes less than 2 seconds.     Comments: Superficial abrasion of patient right forearm.  No obvious puncture wound.  Neurological:     Mental Status: She is alert.  Psychiatric:        Mood and Affect: Mood normal.      ED Results / Procedures / Treatments   Labs (all labs ordered are listed, but only abnormal results are displayed) Labs Reviewed - No data to display  EKG None  Radiology No results found.  Procedures Procedures   Medications Ordered in ED Medications  rabies immune globulin   (HYPERRAB/KEDRAB ) injection 2,175 Units (2,175 Units Infiltration Given 04/02/24 2058)  rabies vaccine  (RABAVERT ) injection 1 mL (1 mL Intramuscular Given 04/02/24 2055)  amoxicillin -clavulanate (AUGMENTIN ) 875-125 MG per tablet 1 tablet (1 tablet Oral Given 04/02/24 2028)    ED Course/ Medical Decision Making/ A&P   Medical Decision Making  23 year old female presents for evaluation of cat bite.  Please see HPI for further details.  Patient right forearm has a superficial abrasion, no puncture wound.  She is unsure of the cat's vaccination status.  Patient reports that this cat is a "stray".  Patient received initial dose of Augmentin  here tonight.  Also received RabAvert  vaccine, immunoglobulin.  Patient was advised that she will need to follow-up in the ED on day 3, 7 and 14 for additional rabies vaccines.  She voiced understanding.  Patient had antibiotic sent to her pharmacy on file.  Stable to discharge.   Final Clinical Impression(s) / ED Diagnoses Final diagnoses:  Cat bite, initial encounter    Rx / DC Orders ED Discharge Orders          Ordered    amoxicillin -clavulanate (AUGMENTIN ) 875-125 MG tablet  Every 12 hours        04/02/24 2150              Adel Aden, PA-C 04/02/24 2151    Kingsley, Victoria K, DO 04/02/24 2340

## 2024-04-02 NOTE — ED Notes (Signed)
 Patient left before discharge vitals or discharge papers could be given.

## 2024-04-02 NOTE — ED Triage Notes (Signed)
 In for eval of cat bite to right forearm at approx 1400. No rabies shot record for cat.

## 2024-04-04 ENCOUNTER — Emergency Department (HOSPITAL_BASED_OUTPATIENT_CLINIC_OR_DEPARTMENT_OTHER): Payer: MEDICAID

## 2024-04-04 ENCOUNTER — Other Ambulatory Visit: Payer: Self-pay

## 2024-04-04 ENCOUNTER — Emergency Department (HOSPITAL_BASED_OUTPATIENT_CLINIC_OR_DEPARTMENT_OTHER)
Admission: EM | Admit: 2024-04-04 | Discharge: 2024-04-05 | Disposition: A | Discharge status: Home / Self Care | Payer: MEDICAID | Attending: Emergency Medicine | Admitting: Emergency Medicine

## 2024-04-04 ENCOUNTER — Encounter (HOSPITAL_BASED_OUTPATIENT_CLINIC_OR_DEPARTMENT_OTHER): Payer: Self-pay

## 2024-04-04 DIAGNOSIS — Z23 Encounter for immunization: Secondary | ICD-10-CM | POA: Diagnosis not present

## 2024-04-04 DIAGNOSIS — W5501XD Bitten by cat, subsequent encounter: Secondary | ICD-10-CM | POA: Diagnosis not present

## 2024-04-04 DIAGNOSIS — Z203 Contact with and (suspected) exposure to rabies: Secondary | ICD-10-CM | POA: Diagnosis not present

## 2024-04-04 DIAGNOSIS — Z2914 Encounter for prophylactic rabies immune globin: Secondary | ICD-10-CM | POA: Diagnosis present

## 2024-04-04 DIAGNOSIS — R569 Unspecified convulsions: Secondary | ICD-10-CM | POA: Diagnosis not present

## 2024-04-04 DIAGNOSIS — R32 Unspecified urinary incontinence: Secondary | ICD-10-CM | POA: Insufficient documentation

## 2024-04-04 DIAGNOSIS — R55 Syncope and collapse: Secondary | ICD-10-CM | POA: Diagnosis present

## 2024-04-04 LAB — CK: Total CK: 129 U/L (ref 38–234)

## 2024-04-04 LAB — URINALYSIS, MICROSCOPIC (REFLEX): RBC / HPF: NONE SEEN RBC/hpf (ref 0–5)

## 2024-04-04 LAB — BASIC METABOLIC PANEL WITH GFR
Anion gap: 13 (ref 5–15)
BUN: 7 mg/dL (ref 6–20)
CO2: 21 mmol/L — ABNORMAL LOW (ref 22–32)
Calcium: 9.6 mg/dL (ref 8.9–10.3)
Chloride: 105 mmol/L (ref 98–111)
Creatinine, Ser: 0.85 mg/dL (ref 0.44–1.00)
GFR, Estimated: >60 mL/min (ref >60)
Glucose, Bld: 110 mg/dL — ABNORMAL HIGH (ref 70–99)
Potassium: 3.6 mmol/L (ref 3.5–5.1)
Sodium: 139 mmol/L (ref 135–145)

## 2024-04-04 LAB — CBC
HCT: 41.2 % (ref 36.0–46.0)
Hemoglobin: 13.6 g/dL (ref 12.0–15.0)
MCH: 25.8 pg — ABNORMAL LOW (ref 26.0–34.0)
MCHC: 33 g/dL (ref 30.0–36.0)
MCV: 78 fL — ABNORMAL LOW (ref 80.0–100.0)
Platelets: 379 10*3/uL (ref 150–400)
RBC: 5.28 MIL/uL — ABNORMAL HIGH (ref 3.87–5.11)
RDW: 14.7 % (ref 11.5–15.5)
WBC: 6.7 10*3/uL (ref 4.0–10.5)
nRBC: 0 % (ref 0.0–0.2)

## 2024-04-04 LAB — URINE DRUG SCREEN
Amphetamines: NOT DETECTED
Barbiturates: NOT DETECTED
Benzodiazepines: NOT DETECTED
Cocaine: NOT DETECTED
Fentanyl: NOT DETECTED
Methadone Scn, Ur: NOT DETECTED
Opiates: NOT DETECTED
Tetrahydrocannabinol: NOT DETECTED

## 2024-04-04 LAB — PREGNANCY, URINE: Preg Test, Ur: NEGATIVE

## 2024-04-04 LAB — CBG MONITORING, ED: Glucose-Capillary: 122 mg/dL — ABNORMAL HIGH (ref 70–99)

## 2024-04-04 LAB — URINALYSIS, ROUTINE W REFLEX MICROSCOPIC
Bilirubin Urine: NEGATIVE
Glucose, UA: NEGATIVE mg/dL
Hgb urine dipstick: NEGATIVE
Ketones, ur: NEGATIVE mg/dL
Nitrite: NEGATIVE
Protein, ur: NEGATIVE mg/dL
Specific Gravity, Urine: 1.02 (ref 1.005–1.030)
pH: 5.5 (ref 5.0–8.0)

## 2024-04-04 LAB — ETHANOL: Alcohol, Ethyl (B): <15 mg/dL (ref <15)

## 2024-04-04 LAB — VITAMIN B12: Vitamin B-12: 613 pg/mL (ref 180–914)

## 2024-04-04 MED ORDER — TOPIRAMATE 25 MG PO TABS
50.0000 mg | ORAL_TABLET | Freq: Once | ORAL | Status: DC
  Filled 2024-04-04: qty 2

## 2024-04-04 MED ORDER — TOPIRAMATE 50 MG PO TABS: 50.0000 mg | ORAL_TABLET | Freq: Every day | ORAL | 0 refills | Status: DC

## 2024-04-04 NOTE — ED Notes (Signed)
 Attempted to get urine sample. Pt states she's unable to urinate.

## 2024-04-04 NOTE — ED Notes (Signed)
 [facility] called for transport.

## 2024-04-04 NOTE — ED Notes (Signed)
 Topamax  not available - EDP aware.

## 2024-04-04 NOTE — ED Notes (Signed)
 EDP at the bedside.  ?
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
## 2024-04-04 NOTE — ED Triage Notes (Signed)
 Patient is a transfer from [facility]; alert/oriented; no acute distress.
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
## 2024-04-05 ENCOUNTER — Other Ambulatory Visit: Payer: Self-pay

## 2024-04-05 ENCOUNTER — Emergency Department (HOSPITAL_COMMUNITY): Payer: MEDICAID

## 2024-04-05 ENCOUNTER — Emergency Department (HOSPITAL_BASED_OUTPATIENT_CLINIC_OR_DEPARTMENT_OTHER)
Admission: EM | Admit: 2024-04-05 | Discharge: 2024-04-05 | Disposition: A | Discharge status: Home / Self Care | Payer: MEDICAID | Attending: Emergency Medicine | Admitting: Emergency Medicine

## 2024-04-05 DIAGNOSIS — W5501XD Bitten by cat, subsequent encounter: Secondary | ICD-10-CM | POA: Insufficient documentation

## 2024-04-05 DIAGNOSIS — Z23 Encounter for immunization: Secondary | ICD-10-CM | POA: Insufficient documentation

## 2024-04-05 DIAGNOSIS — Z2914 Encounter for prophylactic rabies immune globin: Secondary | ICD-10-CM | POA: Insufficient documentation

## 2024-04-05 DIAGNOSIS — Z203 Contact with and (suspected) exposure to rabies: Secondary | ICD-10-CM | POA: Insufficient documentation

## 2024-04-05 MED ORDER — GADOBUTROL 1 MMOL/ML IV SOLN
10.0000 mL | Freq: Once | INTRAVENOUS | Status: AC | PRN
  Administered 2024-04-05: 10 mL via INTRAVENOUS

## 2024-04-05 MED ORDER — RABIES VACCINE, PCEC IM SUSR
1.0000 mL | Freq: Once | INTRAMUSCULAR | Status: AC
  Administered 2024-04-05: 1 mL via INTRAMUSCULAR
  Filled 2024-04-05: qty 1

## 2024-04-05 NOTE — ED Notes (Signed)
 RN reviewed discharge instructions with pt. Pt verbalized understanding and had no further questions. VSS upon discharge.

## 2024-04-05 NOTE — ED Notes (Signed)
 Patient transported to MRI

## 2024-04-05 NOTE — ED Provider Notes (Signed)
 [place] EMERGENCY DEPARTMENT AT [facility] Provider Note   CSN: [id number] Arrival date & time: 04/05/24  1554     History  Chief Complaint  Patient presents with   Rabies Injection    Sydney Hart is a 23 y.o. female.  23 year old female with no past medical history presents to the ED requesting post rabies exposure vaccine.  Patient was bit by a cat approximately 3 days ago, was seen in the emergency department given a prescription for Augmentin , has not filled this prescription yet.  She does report improvement in her cat bite wound.  No other complaints reported.  The history is provided by the patient.       Home Medications Prior to Admission medications   Medication Sig Start Date End Date Taking? Authorizing Provider  acetaminophen  (TYLENOL ) 325 MG tablet Take 2 tablets (650 mg total) by mouth every 4 (four) hours as needed (for pain scale < 4). 08/11/22   Leveque, Alyssa, MD  amoxicillin -clavulanate (AUGMENTIN ) 875-125 MG tablet Take 1 tablet by mouth every 12 (twelve) hours. 04/02/24   Adel Aden, PA-C  benzocaine -Menthol  (DERMOPLAST) 20-0.5 % AERO Apply 1 Application topically as needed for irritation (perineal discomfort). 08/11/22   Mercado-Ortiz, Lindbergh Reusing, DO  Blood Pressure Monitoring (BLOOD PRESSURE KIT) DEVI 1 kit by Does not apply route once a week. 02/20/22   Constant, Peggy, MD  coconut oil OIL Apply 1 Application topically as needed. 08/11/22   Leveque, Alyssa, MD  ibuprofen  (ADVIL ) 600 MG tablet Take 1 tablet (600 mg total) by mouth every 6 (six) hours. 08/11/22   Leveque, Alyssa, MD  Magnesium  Oxide -Mg Supplement (MAG-OXIDE) 200 MG TABS Take 2 tablets (400 mg total) by mouth at bedtime. If that amount causes loose stools in the am, switch to 200mg  daily at bedtime. 07/06/22   Forlan, Nicole, NP  metroNIDAZOLE  (FLAGYL ) 500 MG tablet Take 1 tablet (500 mg total) by mouth 2 (two) times daily. 10/22/22   Thomes Flicker, PA  naproxen  (NAPROSYN ) 375  MG tablet Take 1 tablet (375 mg total) by mouth 2 (two) times daily. 01/17/24   Royann Cords, PA  Prenatal Vit-Fe Phos-FA-Omega (VITAFOL  GUMMIES) 3.33-0.333-34.8 MG CHEW Chew 3 tablets by mouth daily. 02/20/22   Constant, Peggy, MD  senna-docusate (SENOKOT-S) 8.6-50 MG tablet Take 2 tablets by mouth daily. 08/11/22   Mercado-Ortiz, Lindbergh Reusing, DO  topiramate  (TOPAMAX ) 50 MG tablet Take 1 tablet (50 mg total) by mouth at bedtime. 04/04/24   Quinn Bucco, DO      Allergies    Other    Review of Systems   Review of Systems  Constitutional:  Negative for fever.  Skin:  Positive for wound.    Physical Exam Updated Vital Signs BP 118/76   Pulse 72   Temp 98.3 F (36.8 C) (Oral)   Resp 18   LMP 03/16/2024 (Approximate)   SpO2 99%  Physical Exam Vitals and nursing note reviewed.  Constitutional:      Appearance: Normal appearance.  HENT:     Head: Normocephalic and atraumatic.     Mouth/Throat:     Mouth: Mucous membranes are moist.  Cardiovascular:     Rate and Rhythm: Normal rate.  Pulmonary:     Effort: Pulmonary effort is normal.  Abdominal:     General: Abdomen is flat.  Musculoskeletal:     Cervical back: Normal range of motion and neck supple.  Skin:    General: Skin is warm and dry.  Findings: No erythema.     Comments: Right forearm appears without any erythema, no edema, small scabs present.  Neurological:     Mental Status: She is alert and oriented to person, place, and time.     ED Results / Procedures / Treatments   Labs (all labs ordered are listed, but only abnormal results are displayed) Labs Reviewed - No data to display  EKG None  Radiology MR Brain W and Wo Contrast Result Date: 04/05/2024 CLINICAL DATA:  Initial evaluation for acute seizure. EXAM: MRI HEAD WITHOUT AND WITH CONTRAST TECHNIQUE: Multiplanar, multiecho pulse sequences of the brain and surrounding structures were obtained without and with intravenous contrast. CONTRAST:  10mL  GADAVIST GADOBUTROL 1 MMOL/ML IV SOLN COMPARISON:  CT from 04/04/2024. FINDINGS: Brain: Cerebral volume within normal limits for age. No focal parenchymal signal abnormality. No abnormal foci of restricted diffusion to suggest acute or subacute ischemia. Gray-white matter differentiation well maintained. No encephalomalacia to suggest chronic cortical infarction or other insult. No foci of susceptibility artifact indicative of acute or chronic intracranial blood products. No mass lesion, midline shift or mass effect. Ventricles normal in size and morphology without hydrocephalus. No extra-axial fluid collection. Pituitary gland and suprasellar region within normal limits. No intrinsic temporal lobe abnormality. No abnormal enhancement. Vascular: Major intracranial vascular flow voids are well maintained. Skull and upper cervical spine: Craniocervical junction within normal limits. Visualized upper cervical spine demonstrates no significant finding. Bone marrow signal intensity within normal limits. No scalp soft tissue abnormality. Sinuses/Orbits: Globes and orbital soft tissues are within normal limits. Scattered mucosal thickening present about the ethmoidal air cells and maxillary sinuses. Paranasal sinuses are otherwise clear. No significant mastoid effusion. Other: None. IMPRESSION: Normal brain MRI. No acute intracranial abnormality or findings to explain patient's symptoms. Electronically Signed   By: Virgia Griffins M.D.   On: 04/05/2024 02:48   CT Head Wo Contrast Result Date: 04/04/2024 CLINICAL DATA:  Seizure, new-onset, no history of trauma EXAM: CT HEAD WITHOUT CONTRAST TECHNIQUE: Contiguous axial images were obtained from the base of the skull through the vertex without intravenous contrast. RADIATION DOSE REDUCTION: This exam was performed according to the departmental dose-optimization program which includes automated exposure control, adjustment of the mA and/or kV according to patient size  and/or use of iterative reconstruction technique. COMPARISON:  CT head 03/28/2024 FINDINGS: Brain: No evidence of large-territorial acute infarction. No parenchymal hemorrhage. No mass lesion. No extra-axial collection. No mass effect or midline shift. No hydrocephalus. Basilar cisterns are patent. Vascular: No hyperdense vessel. Skull: No acute fracture or focal lesion. Sinuses/Orbits: Right maxillary and bilateral ethmoid mucosal thickening. Paranasal sinuses and mastoid air cells are clear. The orbits are unremarkable. Other: None. IMPRESSION: No acute intracranial abnormality. Electronically Signed   By: Morgane  Naveau M.D.   On: 04/04/2024 20:00   DG Chest Portable 1 View Result Date: 04/04/2024 CLINICAL DATA:  Syncopal episode today. EXAM: PORTABLE CHEST 1 VIEW COMPARISON:  One view chest x-ray 03/28/2024 FINDINGS: The heart size and mediastinal contours are within normal limits. Both lungs are clear. The visualized skeletal structures are unremarkable. IMPRESSION: Negative one view chest x-ray Electronically Signed   By: Audree Leas M.D.   On: 04/04/2024 19:49    Procedures Procedures    Medications Ordered in ED Medications  rabies vaccine  (RABAVERT ) injection 1 mL (has no administration in time range)    ED Course/ Medical Decision Making/ A&P  Medical Decision Making Risk Prescription drug management.    Patient presents to the ED with a chief complaint of requesting vaccination for rabies exposure.  She was bit approximately 3 days ago, was told to return for consistent subsequently vaccination.  On today's visit wound appears dry, without any erythema, no skin changes.  She has not began taking the antibiotics that she was prescribed approximately 3 days ago.  Rabavert  vaccine has been ordered, the immunoglobulin was provided to her on the first visit.  We discussed appropriate follow-up with urgent care in order to obtain subsequently  vaccination.  She is agreeable to plan and treatment, patient stable for discharge.    Portions of this note were generated with Scientist, clinical (histocompatibility and immunogenetics). Dictation errors may occur despite best attempts at proofreading.   Final Clinical Impression(s) / ED Diagnoses Final diagnoses:  Cat bite, subsequent encounter    Rx / DC Orders ED Discharge Orders     None         Gowri Suchan, PA-C 04/05/24 1745    Rolinda Climes, DO 04/05/24 2225

## 2024-04-05 NOTE — ED Triage Notes (Signed)
 Pt POV requesting second round of rabies vaccine , cat bite 4/23.

## 2024-04-05 NOTE — Discharge Instructions (Signed)
 You will need to continue with vaccination to help prevent rabies.  Please pick up your antibiotics at the pharmacy and begin taking these.

## 2024-04-07 ENCOUNTER — Encounter: Payer: Self-pay | Admitting: Neurology

## 2024-04-14 ENCOUNTER — Encounter (HOSPITAL_BASED_OUTPATIENT_CLINIC_OR_DEPARTMENT_OTHER): Payer: Self-pay

## 2024-04-14 ENCOUNTER — Other Ambulatory Visit: Payer: Self-pay

## 2024-04-14 ENCOUNTER — Emergency Department (HOSPITAL_BASED_OUTPATIENT_CLINIC_OR_DEPARTMENT_OTHER)
Admission: EM | Admit: 2024-04-14 | Discharge: 2024-04-14 | Disposition: A | Discharge status: Home / Self Care | Payer: MEDICAID

## 2024-04-14 DIAGNOSIS — Z203 Contact with and (suspected) exposure to rabies: Secondary | ICD-10-CM | POA: Insufficient documentation

## 2024-04-14 DIAGNOSIS — W5501XD Bitten by cat, subsequent encounter: Secondary | ICD-10-CM

## 2024-04-14 DIAGNOSIS — Z23 Encounter for immunization: Secondary | ICD-10-CM | POA: Diagnosis present

## 2024-04-14 MED ORDER — RABIES VACCINE, PCEC IM SUSR
1.0000 mL | Freq: Once | INTRAMUSCULAR | Status: AC
  Administered 2024-04-14: 1 mL via INTRAMUSCULAR
  Filled 2024-04-14: qty 1

## 2024-04-14 NOTE — ED Notes (Signed)
 No reaction to injection reported or assessed

## 2024-04-14 NOTE — ED Provider Notes (Signed)
 [place] EMERGENCY DEPARTMENT AT [facility] [place] Provider Note   CSN: [id number] Arrival date & time: 04/14/24  1001     History  Chief Complaint  Patient presents with   Rabies Injection    Sydney Hart is a 23 y.o. female.  24 year old female presenting emergency department for final rabies vaccine .  Not having any symptoms.        Home Medications Prior to Admission medications   Medication Sig Start Date End Date Taking? Authorizing Provider  acetaminophen  (TYLENOL ) 325 MG tablet Take 2 tablets (650 mg total) by mouth every 4 (four) hours as needed (for pain scale < 4). 08/11/22   Leveque, Alyssa, MD  amoxicillin -clavulanate (AUGMENTIN ) 875-125 MG tablet Take 1 tablet by mouth every 12 (twelve) hours. 04/02/24   Adel Aden, PA-C  benzocaine -Menthol  (DERMOPLAST) 20-0.5 % AERO Apply 1 Application topically as needed for irritation (perineal discomfort). 08/11/22   Mercado-Ortiz, Lindbergh Reusing, DO  Blood Pressure Monitoring (BLOOD PRESSURE KIT) DEVI 1 kit by Does not apply route once a week. 02/20/22   Constant, Peggy, MD  coconut oil OIL Apply 1 Application topically as needed. 08/11/22   Leveque, Alyssa, MD  ibuprofen  (ADVIL ) 600 MG tablet Take 1 tablet (600 mg total) by mouth every 6 (six) hours. 08/11/22   Leveque, Alyssa, MD  Magnesium  Oxide -Mg Supplement (MAG-OXIDE) 200 MG TABS Take 2 tablets (400 mg total) by mouth at bedtime. If that amount causes loose stools in the am, switch to 200mg  daily at bedtime. 07/06/22   Forlan, Nicole, NP  metroNIDAZOLE  (FLAGYL ) 500 MG tablet Take 1 tablet (500 mg total) by mouth 2 (two) times daily. 10/22/22   Thomes Flicker, PA  naproxen  (NAPROSYN ) 375 MG tablet Take 1 tablet (375 mg total) by mouth 2 (two) times daily. 01/17/24   Royann Cords, PA  Prenatal Vit-Fe Phos-FA-Omega (VITAFOL  GUMMIES) 3.33-0.333-34.8 MG CHEW Chew 3 tablets by mouth daily. 02/20/22   Constant, Peggy, MD  senna-docusate (SENOKOT-S) 8.6-50 MG tablet Take 2  tablets by mouth daily. 08/11/22   Mercado-Ortiz, Lindbergh Reusing, DO  topiramate  (TOPAMAX ) 50 MG tablet Take 1 tablet (50 mg total) by mouth at bedtime. 04/04/24   Quinn Bucco, DO      Allergies    Other    Review of Systems   Review of Systems  Physical Exam Updated Vital Signs BP (!) 144/75 (BP Location: Right Arm)   Pulse 79   Temp 98.2 F (36.8 C) (Oral)   Resp 17   Ht 5\' 3"  (1.6 m)   Wt 108.9 kg   LMP 03/16/2024 (Approximate)   SpO2 100%   BMI 42.51 kg/m  Physical Exam Vitals and nursing note reviewed.  Constitutional:      General: She is not in acute distress. HENT:     Nose: Nose normal.  Eyes:     Conjunctiva/sclera: Conjunctivae normal.  Musculoskeletal:     Comments: Hand with no swelling induration or fluctuance.  Neurovascular intact.  Skin:    Capillary Refill: Capillary refill takes less than 2 seconds.  Neurological:     Mental Status: She is alert and oriented to person, place, and time.  Psychiatric:        Mood and Affect: Mood normal.        Behavior: Behavior normal.     ED Results / Procedures / Treatments   Labs (all labs ordered are listed, but only abnormal results are displayed) Labs Reviewed - No data to display  EKG  None  Radiology No results found.  Procedures Procedures    Medications Ordered in ED Medications  rabies vaccine  (RABAVERT ) injection 1 mL (1 mL Intramuscular Given 04/14/24 1204)    ED Course/ Medical Decision Making/ A&P                                 Medical Decision Making 23 year old female presents for final rabies vaccine .  Vital signs reassuring.  Asymptomatic with no complaints.  Not having symptoms.  Gave final rabies vaccine .  Stable for discharge.  Risk Prescription drug management.         Final Clinical Impression(s) / ED Diagnoses Final diagnoses:  None    Rx / DC Orders ED Discharge Orders     None         Rolinda Climes, DO 04/14/24 1218

## 2024-04-14 NOTE — Discharge Instructions (Signed)
 Please follow-up with your primary doctor.  Return felt fevers, chills, chest pain, shortness of breath, pain, swelling, lethargy, severe headaches or any new or worsening symptoms that are concerning to you.

## 2024-04-14 NOTE — ED Triage Notes (Signed)
 In for next rabies shot in series.

## 2024-05-14 ENCOUNTER — Ambulatory Visit: Payer: MEDICAID | Admitting: Neurology

## 2024-05-14 VITALS — BP 109/72 | HR 96 | Ht 63.0 in | Wt 244.0 lb

## 2024-05-14 DIAGNOSIS — G43009 Migraine without aura, not intractable, without status migrainosus: Secondary | ICD-10-CM

## 2024-05-14 DIAGNOSIS — R4020 Unspecified coma: Secondary | ICD-10-CM

## 2024-05-14 DIAGNOSIS — R4182 Altered mental status, unspecified: Secondary | ICD-10-CM

## 2024-05-14 DIAGNOSIS — R55 Syncope and collapse: Secondary | ICD-10-CM

## 2024-05-14 NOTE — Patient Instructions (Signed)
Routine EEG  Electroencephalogram (EEG) Instructions  To prepare for your EEG: . Wash your hair the night before or the day of the test, but don't use any conditioners, hair creams, sprays or styling gels.  . Avoid anything with caffeine 6 hours before the test. . Take your usual medications unless instructed otherwise. If you're supposed to sleep during your EEG test, your doctor may ask you to sleep less or even avoid sleep entirely the night before your EEG. If you have trouble falling asleep for the test, you might be given a sedative to help you relax.   What to expect You'll feel little or no discomfort during an EEG. The electrodes don't transmit any sensations. They just record your brain waves. If you need to sleep during the EEG, you might be given a sedative beforehand to help you relax. During the test: . A technician measures your head and marks your scalp with a type of pencil, to indicate where to attach the electrodes. Those spots on your scalp may be scrubbed with a gritty cream to improve the quality of the recording.  . A technician attaches flat metal discs (electrodes) to your scalp using a special adhesive. The electrodes are connected with wires to an instrument that amplifies - makes bigger - the brain waves and records them on computer equipment. Some people wear and elastic cap fitted with electrodes, instead of having the adhesive applied to their scalps. Once the electrodes are in place, an EEG typically takes 30-60 minutes.  . You relax in a comfortable position with your eyes closed during the test. At various times, the technician may ask you to open and close your eyes, perform a few simple calculations, read a paragraph, look at a picture, breathe deeply (hyperventilate) for a few minutes, or look at a flashing light. .  After the test After the test, the technician removes the electrodes or cap. If no sedative was given, you should feel no side effects after the  procedure, and you can return to your normal routine.  If you used a sedative, it may take about an hour to partially recover from the medication. You'll need someone to take you home because it can take up to a day for the full effects of the sedative to wear off. Rest and don't drive for the remainder of the day.
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
## 2024-05-16 ENCOUNTER — Ambulatory Visit: Payer: MEDICAID | Admitting: Neurology

## 2024-05-16 DIAGNOSIS — R55 Syncope and collapse: Secondary | ICD-10-CM

## 2024-05-16 DIAGNOSIS — R4020 Unspecified coma: Secondary | ICD-10-CM

## 2024-05-16 DIAGNOSIS — G43009 Migraine without aura, not intractable, without status migrainosus: Secondary | ICD-10-CM

## 2024-05-16 NOTE — Progress Notes (Signed)
 EEG complete and ready for review.

## 2024-05-20 ENCOUNTER — Ambulatory Visit: Payer: MEDICAID | Admitting: Neurology

## 2024-05-25 NOTE — Procedures (Signed)
 ELECTROENCEPHALOGRAM REPORT  Date of Study: 05/16/2024  Patient's Name: Sydney Hart MRN: [id number] Date of Birth: 06-10-2001  Referring Provider: Dr. Reyna Cava  Clinical History: This is a 23 year old woman with episodes of loss of awareness with incontinence. EEG for classification.  CNS Active Medications: none  Technical Summary: A multichannel digital 1-hour EEG recording measured by the international 10-20 system with electrodes applied with paste and impedances below 5000 ohms performed in our laboratory with EKG monitoring in an awake and asleep patient.  Hyperventilation was not performed. Photic stimulation was performed.  The digital EEG was referentially recorded, reformatted, and digitally filtered in a variety of bipolar and referential montages for optimal display.    Description: The patient is awake and asleep during the recording.  During maximal wakefulness, there is a symmetric, medium voltage 9 Hz posterior dominant rhythm that attenuates with eye opening.  The record is symmetric.  During drowsiness and sleep, there is an increase in theta slowing of the background.  Vertex waves and symmetric sleep spindles were seen. Photic stimulation did not elicit any abnormalities.  There were no epileptiform discharges or electrographic seizures seen.    EKG lead was unremarkable.  Impression: This 1-hour awake and asleep EEG is normal.    Clinical Correlation: A normal EEG does not exclude a clinical diagnosis of epilepsy.  If further clinical questions remain, prolonged EEG may be helpful.  Clinical correlation is advised.   Rayfield Cairo, M.D.

## 2024-05-26 ENCOUNTER — Ambulatory Visit: Payer: Self-pay | Admitting: Neurology

## 2024-05-26 DIAGNOSIS — R4020 Unspecified coma: Secondary | ICD-10-CM

## 2024-05-26 DIAGNOSIS — R55 Syncope and collapse: Secondary | ICD-10-CM

## 2024-07-01 ENCOUNTER — Ambulatory Visit (INDEPENDENT_AMBULATORY_CARE_PROVIDER_SITE_OTHER): Payer: MEDICAID | Admitting: Neurology

## 2024-07-01 DIAGNOSIS — R4020 Unspecified coma: Secondary | ICD-10-CM

## 2024-07-01 DIAGNOSIS — R55 Syncope and collapse: Secondary | ICD-10-CM

## 2024-07-01 NOTE — Progress Notes (Signed)
 Ambulatory EEG hooked up and running. Light flashing. Push button tested. Camera and event log explained. Batteries explained. Patient understood.

## 2024-07-03 NOTE — Progress Notes (Signed)
 AMB EEG discontinued.  Skin Breakdown:No Diary Returned: No

## 2024-07-22 NOTE — Procedures (Signed)
 ELECTROENCEPHALOGRAM REPORT  Dates of Recording: 07/01/2024 10:34AM to 07/03/2024 10:00am  Patient's Name: Sydney Hart MRN: [id number] Date of Birth: March 14, 2001  Referring Provider: Dr. Tonita Blanch  Procedure: 47-hour ambulatory EEG  History: This is a 23 year old woman with episodes of loss of awareness with incontinence. EEG for classification.   CNS Active Medications: none  Technical Summary: This is a 47-hour multichannel digital EEG recording measured by the international 10-20 system with electrodes applied with paste and impedances below 5000 ohms performed as portable with EKG monitoring.  The digital EEG was referentially recorded, reformatted, and digitally filtered in a variety of bipolar and referential montages for optimal display.    DESCRIPTION OF RECORDING: During maximal wakefulness, the background activity consisted of a symmetric 10-11 Hz posterior dominant rhythm which was reactive to eye opening.  There were no epileptiform discharges or focal slowing seen in wakefulness.  During the recording, the patient progresses through wakefulness, drowsiness, and Stage 2 sleep.  Again, there were no epileptiform discharges seen.  Events: There were 11 push button events that appear accidental. No video recorded. No symptoms reported on diary. Electrographically, there were no EEG or EKG changes seen.  There were no electrographic seizures seen.  EKG lead was unremarkable at times with sinus tachycardia to 102 bpm in wakefulness.  IMPRESSION: This 47-hour ambulatory EEG study is normal.    CLINICAL CORRELATION: A normal EEG does not exclude a clinical diagnosis of epilepsy. Typical events were not reported. If further clinical questions remain, inpatient video EEG monitoring may be helpful.   Darice Shivers, M.D.

## 2024-07-25 ENCOUNTER — Ambulatory Visit: Payer: Self-pay | Admitting: Neurology

## 2024-12-10 ENCOUNTER — Emergency Department (HOSPITAL_COMMUNITY): Payer: MEDICAID

## 2024-12-10 ENCOUNTER — Emergency Department (HOSPITAL_COMMUNITY)
Admission: EM | Admit: 2024-12-10 | Discharge: 2024-12-11 | Disposition: A | Discharge status: Home / Self Care | Payer: MEDICAID | Attending: Emergency Medicine | Admitting: Emergency Medicine

## 2024-12-10 ENCOUNTER — Encounter (HOSPITAL_COMMUNITY): Payer: Self-pay

## 2024-12-10 ENCOUNTER — Other Ambulatory Visit: Payer: Self-pay

## 2024-12-10 DIAGNOSIS — R1030 Lower abdominal pain, unspecified: Secondary | ICD-10-CM | POA: Diagnosis not present

## 2024-12-10 DIAGNOSIS — M549 Dorsalgia, unspecified: Secondary | ICD-10-CM | POA: Diagnosis not present

## 2024-12-10 DIAGNOSIS — R102 Pelvic and perineal pain unspecified side: Secondary | ICD-10-CM | POA: Insufficient documentation

## 2024-12-10 LAB — CBC WITH DIFFERENTIAL/PLATELET
Abs Immature Granulocytes: 0.02 K/uL (ref 0.00–0.07)
Basophils Absolute: 0 K/uL (ref 0.0–0.1)
Basophils Relative: 0 %
Eosinophils Absolute: 0.3 K/uL (ref 0.0–0.5)
Eosinophils Relative: 3 %
HCT: 38.8 % (ref 36.0–46.0)
Hemoglobin: 12.8 g/dL (ref 12.0–15.0)
Immature Granulocytes: 0 %
Lymphocytes Relative: 37 %
Lymphs Abs: 3.5 K/uL (ref 0.7–4.0)
MCH: 26.4 pg (ref 26.0–34.0)
MCHC: 33 g/dL (ref 30.0–36.0)
MCV: 80.2 fL (ref 80.0–100.0)
Monocytes Absolute: 0.9 K/uL (ref 0.1–1.0)
Monocytes Relative: 9 %
Neutro Abs: 4.8 K/uL (ref 1.7–7.7)
Neutrophils Relative %: 51 %
Platelets: 389 K/uL (ref 150–400)
RBC: 4.84 MIL/uL (ref 3.87–5.11)
RDW: 14.1 % (ref 11.5–15.5)
WBC: 9.5 K/uL (ref 4.0–10.5)
nRBC: 0 % (ref 0.0–0.2)

## 2024-12-10 LAB — COMPREHENSIVE METABOLIC PANEL WITH GFR
ALT: 11 U/L (ref 0–44)
AST: 22 U/L (ref 15–41)
Albumin: 4.2 g/dL (ref 3.5–5.0)
Alkaline Phosphatase: 66 U/L (ref 38–126)
Anion gap: 9 (ref 5–15)
BUN: 8 mg/dL (ref 6–20)
CO2: 26 mmol/L (ref 22–32)
Calcium: 9.6 mg/dL (ref 8.9–10.3)
Chloride: 105 mmol/L (ref 98–111)
Creatinine, Ser: 0.83 mg/dL (ref 0.44–1.00)
GFR, Estimated: >60 mL/min
Glucose, Bld: 92 mg/dL (ref 70–99)
Potassium: 3.9 mmol/L (ref 3.5–5.1)
Sodium: 139 mmol/L (ref 135–145)
Total Bilirubin: 0.8 mg/dL (ref 0.0–1.2)
Total Protein: 7.4 g/dL (ref 6.5–8.1)

## 2024-12-10 LAB — URINALYSIS, ROUTINE W REFLEX MICROSCOPIC
Bilirubin Urine: NEGATIVE
Glucose, UA: NEGATIVE mg/dL
Hgb urine dipstick: NEGATIVE
Ketones, ur: 5 mg/dL — AB
Leukocytes,Ua: NEGATIVE
Nitrite: NEGATIVE
Protein, ur: NEGATIVE mg/dL
Specific Gravity, Urine: 1.018 (ref 1.005–1.030)
pH: 5 (ref 5.0–8.0)

## 2024-12-10 LAB — LIPASE, BLOOD: Lipase: 29 U/L (ref 11–51)

## 2024-12-10 LAB — HCG, SERUM, QUALITATIVE: Preg, Serum: NEGATIVE

## 2024-12-10 NOTE — ED Provider Notes (Signed)
 "  EMERGENCY DEPARTMENT AT [facility] Provider Note   CSN: [id number] Arrival date & time: 12/10/24  1610     Patient presents with: Pelvic Pain   Sydney Hart is a 23 y.o. female.  {Add pertinent medical, surgical, social history, OB history to HPI:32947} 23 yo F with a cc of lower abdominal pain.  Patient tells me she has had this pain for 2 years.  Seems to come and go in severity, worse with squatting.  Feels like a pressure.  She has had some radiation down the back of her leg.  Constipated.  Feels like she needs to urinate but when she tries doesn't get much out.  No hematuria.  No vaginal bleeding no discharge.   She also has known radicular back pain.  Thinks that this is different.   Pelvic Pain       Prior to Admission medications  Medication Sig Start Date End Date Taking? Authorizing Provider  acetaminophen  (TYLENOL ) 325 MG tablet Take 2 tablets (650 mg total) by mouth every 4 (four) hours as needed (for pain scale < 4). 08/11/22   Leveque, Alyssa, MD  benzocaine -Menthol  (DERMOPLAST) 20-0.5 % AERO Apply 1 Application topically as needed for irritation (perineal discomfort). 08/11/22   Mercado-Ortiz, Harlene RODES, DO  Blood Pressure Monitoring (BLOOD PRESSURE KIT) DEVI 1 kit by Does not apply route once a week. 02/20/22   Constant, Peggy, MD  coconut oil OIL Apply 1 Application topically as needed. 08/11/22   Leveque, Alyssa, MD  ibuprofen  (ADVIL ) 600 MG tablet Take 1 tablet (600 mg total) by mouth every 6 (six) hours. 08/11/22   Leveque, Alyssa, MD  Magnesium  Oxide -Mg Supplement (MAG-OXIDE) 200 MG TABS Take 2 tablets (400 mg total) by mouth at bedtime. If that amount causes loose stools in the am, switch to 200mg  daily at bedtime. 07/06/22   Forlan, Nicole, NP  naproxen  (NAPROSYN ) 375 MG tablet Take 1 tablet (375 mg total) by mouth 2 (two) times daily. 01/17/24   Minnie Tinnie BRAVO, PA  senna-docusate (SENOKOT-S) 8.6-50 MG tablet Take 2 tablets by mouth  daily. 08/11/22   Mercado-Ortiz, Harlene RODES, DO    Allergies: Other and Topamax  [topiramate ]    Review of Systems  Genitourinary:  Positive for pelvic pain.    Updated Vital Signs BP (!) 136/105 (BP Location: Left Arm)   Pulse 91   Temp 98.3 F (36.8 C) (Oral)   Resp 18   LMP 12/01/2024 (Exact Date)   SpO2 100%   Physical Exam Vitals and nursing note reviewed.  Constitutional:      General: She is not in acute distress.    Appearance: She is well-developed. She is not diaphoretic.  HENT:     Head: Normocephalic and atraumatic.  Eyes:     Pupils: Pupils are equal, round, and reactive to light.  Cardiovascular:     Rate and Rhythm: Normal rate and regular rhythm.     Heart sounds: No murmur heard.    No friction rub. No gallop.  Pulmonary:     Effort: Pulmonary effort is normal.     Breath sounds: No wheezing or rales.  Abdominal:     General: There is no distension.     Palpations: Abdomen is soft.     Tenderness: There is no abdominal tenderness.     Comments: Patient tells me she has pain lower down in the abdomen about the pubic symphysis.  No obvious mass no unilateral tenderness.  No significant  tenderness on exam.    Musculoskeletal:        General: No tenderness.     Cervical back: Normal range of motion and neck supple.     Comments: Pulse motor and sensation are intact to the right lower extremity.  Reflexes are 2+ and equal.  No clonus  Skin:    General: Skin is warm and dry.  Neurological:     Mental Status: She is alert and oriented to person, place, and time.  Psychiatric:        Behavior: Behavior normal.     (all labs ordered are listed, but only abnormal results are displayed) Labs Reviewed  URINALYSIS, ROUTINE W REFLEX MICROSCOPIC - Abnormal; Notable for the following components:      Result Value   APPearance HAZY (*)    Ketones, ur 5 (*)    All other components within normal limits  COMPREHENSIVE METABOLIC PANEL WITH GFR  LIPASE, BLOOD   CBC WITH DIFFERENTIAL/PLATELET  HCG, SERUM, QUALITATIVE    EKG: None  Radiology: No results found.  {Document cardiac monitor, telemetry assessment procedure when appropriate:32947} Procedures   Medications Ordered in the ED - No data to display    {Click here for ABCD2, HEART and other calculators REFRESH Note before signing:1}                              Medical Decision Making Amount and/or Complexity of Data Reviewed Radiology: ordered.   23 yo F with 2 years of lower abdominal discomfort comes in on New Year's Eve to be evaluated.  She feels like maybe it is worse and she is now having radiation down the leg.  She then later tells me that she has known radicular low back pain.  Lab workup here was unremarkable, no obvious infection on UA on my independent interpretation.  No significant electrolyte abnormalities.  No leukocytosis.  Pregnancy test is negative.  Will obtain CT imaging to assess for subacute pelvic pathology.  {Document critical care time when appropriate  Document review of labs and clinical decision tools ie CHADS2VASC2, etc  Document your independent review of radiology images and any outside records  Document your discussion with family members, caretakers and with consultants  Document social determinants of health affecting pt's care  Document your decision making why or why not admission, treatments were needed:32947:::1}   Final diagnoses:  None    ED Discharge Orders     None        "

## 2024-12-10 NOTE — ED Triage Notes (Signed)
 Pt ambulatory to triage accompanied by boyfriend. PT has complaints of pelvic pain that has been persistent, but worsened over the past month. Pt states that today at work the pain suddenly went down her leg, which was new.  Pt denies vaginal bleeding, endorses irregular menses.   LMP: 12/01/24

## 2024-12-11 NOTE — Discharge Instructions (Addendum)
 Your CT and labs look ok. Follow up with your family doc in the office. Follow up with GYN.   Take 4 over the counter ibuprofen  tablets 3 times a day or 2 over-the-counter naproxen  tablets twice a day for pain. Also take tylenol  1000mg (2 extra strength) four times a day.
